# Patient Record
Sex: Female | Born: 1985 | Race: White | Hispanic: No | Marital: Married | Smoking: Former smoker
Health system: Southern US, Community
[De-identification: ages and names within clinical notes are randomized; demographics above are authoritative.]

## PROBLEM LIST (undated history)

## (undated) DIAGNOSIS — N390 Urinary tract infection, site not specified: Secondary | ICD-10-CM

## (undated) DIAGNOSIS — N809 Endometriosis, unspecified: Secondary | ICD-10-CM

## (undated) DIAGNOSIS — B009 Herpesviral infection, unspecified: Secondary | ICD-10-CM

## (undated) DIAGNOSIS — F32A Depression, unspecified: Secondary | ICD-10-CM

## (undated) DIAGNOSIS — F419 Anxiety disorder, unspecified: Secondary | ICD-10-CM

## (undated) HISTORY — PX: RHINOPLASTY: SHX2354

## (undated) HISTORY — PX: DG THUMB LEFT HAND: HXRAD1658

## (undated) HISTORY — PX: EXPLORATORY LAPAROTOMY: SUR591

## (undated) HISTORY — PX: TONSILLECTOMY: SUR1361

## (undated) HISTORY — PX: ANKLE SURGERY: SHX546

## (undated) HISTORY — PX: ANTERIOR CRUCIATE LIGAMENT REPAIR: SHX115

## (undated) HISTORY — PX: ADENOIDECTOMY: SHX5191

---

## 2016-03-06 ENCOUNTER — Other Ambulatory Visit: Payer: Self-pay | Admitting: Allergy and Immunology

## 2016-03-06 NOTE — Telephone Encounter (Signed)
Needs ov for further refills 

## 2016-03-15 DIAGNOSIS — M7671 Peroneal tendinitis, right leg: Secondary | ICD-10-CM | POA: Diagnosis not present

## 2016-04-25 ENCOUNTER — Other Ambulatory Visit: Payer: Self-pay | Admitting: Allergy and Immunology

## 2016-05-05 DIAGNOSIS — F419 Anxiety disorder, unspecified: Secondary | ICD-10-CM | POA: Diagnosis not present

## 2016-05-05 DIAGNOSIS — R1011 Right upper quadrant pain: Secondary | ICD-10-CM | POA: Diagnosis not present

## 2016-05-05 DIAGNOSIS — R635 Abnormal weight gain: Secondary | ICD-10-CM | POA: Diagnosis not present

## 2016-05-11 DIAGNOSIS — R1011 Right upper quadrant pain: Secondary | ICD-10-CM | POA: Diagnosis not present

## 2016-05-11 DIAGNOSIS — F419 Anxiety disorder, unspecified: Secondary | ICD-10-CM | POA: Diagnosis not present

## 2016-05-16 DIAGNOSIS — F419 Anxiety disorder, unspecified: Secondary | ICD-10-CM | POA: Diagnosis not present

## 2016-07-03 ENCOUNTER — Other Ambulatory Visit: Payer: Self-pay | Admitting: Physician Assistant

## 2016-07-03 DIAGNOSIS — R1011 Right upper quadrant pain: Secondary | ICD-10-CM

## 2016-07-03 DIAGNOSIS — R110 Nausea: Secondary | ICD-10-CM | POA: Diagnosis not present

## 2016-07-03 DIAGNOSIS — R198 Other specified symptoms and signs involving the digestive system and abdomen: Secondary | ICD-10-CM | POA: Diagnosis not present

## 2016-07-12 ENCOUNTER — Ambulatory Visit
Admission: RE | Admit: 2016-07-12 | Discharge: 2016-07-12 | Disposition: A | Discharge status: Home / Self Care | Payer: Self-pay | Source: Ambulatory Visit | Attending: Physician Assistant | Admitting: Physician Assistant

## 2016-07-12 DIAGNOSIS — R1011 Right upper quadrant pain: Secondary | ICD-10-CM

## 2016-07-12 DIAGNOSIS — R109 Unspecified abdominal pain: Secondary | ICD-10-CM | POA: Diagnosis not present

## 2016-07-12 DIAGNOSIS — R110 Nausea: Secondary | ICD-10-CM

## 2016-07-31 ENCOUNTER — Other Ambulatory Visit: Payer: Self-pay | Admitting: Physician Assistant

## 2016-07-31 ENCOUNTER — Other Ambulatory Visit (HOSPITAL_COMMUNITY): Payer: Self-pay | Admitting: Physician Assistant

## 2016-07-31 DIAGNOSIS — R197 Diarrhea, unspecified: Secondary | ICD-10-CM

## 2016-07-31 DIAGNOSIS — R109 Unspecified abdominal pain: Secondary | ICD-10-CM | POA: Diagnosis not present

## 2016-07-31 DIAGNOSIS — R110 Nausea: Secondary | ICD-10-CM | POA: Diagnosis not present

## 2016-07-31 DIAGNOSIS — R1011 Right upper quadrant pain: Secondary | ICD-10-CM

## 2016-08-10 ENCOUNTER — Ambulatory Visit
Admission: RE | Admit: 2016-08-10 | Discharge: 2016-08-10 | Disposition: A | Discharge status: Home / Self Care | Payer: BLUE CROSS/BLUE SHIELD | Source: Ambulatory Visit | Attending: Physician Assistant | Admitting: Physician Assistant

## 2016-08-10 ENCOUNTER — Encounter: Payer: Self-pay | Admitting: Radiology

## 2016-08-10 DIAGNOSIS — R110 Nausea: Secondary | ICD-10-CM

## 2016-08-10 DIAGNOSIS — R197 Diarrhea, unspecified: Secondary | ICD-10-CM

## 2016-08-10 DIAGNOSIS — R109 Unspecified abdominal pain: Secondary | ICD-10-CM

## 2016-08-10 DIAGNOSIS — R1031 Right lower quadrant pain: Secondary | ICD-10-CM | POA: Diagnosis not present

## 2016-08-10 MED ORDER — IOPAMIDOL (ISOVUE-300) INJECTION 61%
100.0000 mL | Freq: Once | INTRAVENOUS | Status: AC | PRN
Start: 2016-08-10 — End: 2016-08-10
  Administered 2016-08-10: 100 mL via INTRAVENOUS

## 2016-08-15 ENCOUNTER — Encounter (HOSPITAL_COMMUNITY)
Admission: RE | Admit: 2016-08-15 | Discharge: 2016-08-15 | Disposition: A | Discharge status: Home / Self Care | Payer: BLUE CROSS/BLUE SHIELD | Source: Ambulatory Visit | Attending: Physician Assistant | Admitting: Physician Assistant

## 2016-08-15 DIAGNOSIS — R110 Nausea: Secondary | ICD-10-CM | POA: Insufficient documentation

## 2016-08-15 DIAGNOSIS — R1011 Right upper quadrant pain: Secondary | ICD-10-CM | POA: Diagnosis not present

## 2016-08-15 MED ORDER — TECHNETIUM TC 99M MEBROFENIN IV KIT
5.2000 | PACK | Freq: Once | INTRAVENOUS | Status: AC | PRN
  Administered 2016-08-15: 5 via INTRAVENOUS

## 2016-09-04 DIAGNOSIS — J3089 Other allergic rhinitis: Secondary | ICD-10-CM | POA: Diagnosis not present

## 2016-09-08 DIAGNOSIS — R1011 Right upper quadrant pain: Secondary | ICD-10-CM | POA: Diagnosis not present

## 2016-09-08 DIAGNOSIS — N809 Endometriosis, unspecified: Secondary | ICD-10-CM | POA: Diagnosis not present

## 2016-11-02 DIAGNOSIS — Z713 Dietary counseling and surveillance: Secondary | ICD-10-CM | POA: Diagnosis not present

## 2016-11-20 DIAGNOSIS — J019 Acute sinusitis, unspecified: Secondary | ICD-10-CM | POA: Diagnosis not present

## 2017-01-02 DIAGNOSIS — Z713 Dietary counseling and surveillance: Secondary | ICD-10-CM | POA: Diagnosis not present

## 2017-01-23 DIAGNOSIS — N809 Endometriosis, unspecified: Secondary | ICD-10-CM | POA: Diagnosis not present

## 2017-01-23 DIAGNOSIS — Z01419 Encounter for gynecological examination (general) (routine) without abnormal findings: Secondary | ICD-10-CM | POA: Diagnosis not present

## 2017-01-23 DIAGNOSIS — Z6832 Body mass index (BMI) 32.0-32.9, adult: Secondary | ICD-10-CM | POA: Diagnosis not present

## 2017-01-23 DIAGNOSIS — L680 Hirsutism: Secondary | ICD-10-CM | POA: Diagnosis not present

## 2017-02-09 DIAGNOSIS — J3089 Other allergic rhinitis: Secondary | ICD-10-CM | POA: Diagnosis not present

## 2017-02-16 DIAGNOSIS — Z713 Dietary counseling and surveillance: Secondary | ICD-10-CM | POA: Diagnosis not present

## 2017-03-01 IMAGING — CT CT ABD-PELV W/ CM
1 of 2 series · 15 of 32 positions shown, 19 images · IV contrast (APPLIED)
Comparison: None.

CLINICAL DATA: Right lower quadrant/flank pain.

EXAM:
CT ABDOMEN AND PELVIS WITH CONTRAST
TECHNIQUE: Multidetector CT imaging of the abdomen and pelvis was performed
using the standard protocol following bolus administration of
intravenous contrast.
CONTRAST:  100mL SQE2CU-IAA IOPAMIDOL (SQE2CU-IAA) INJECTION 61%

[Series 2: abd/pelvis w/cm · axial · 0.82mm/px · z∈[-475,-35]mm · 15 of 97 slices shown, 19 images]
[im 5/97  soft-tissue]
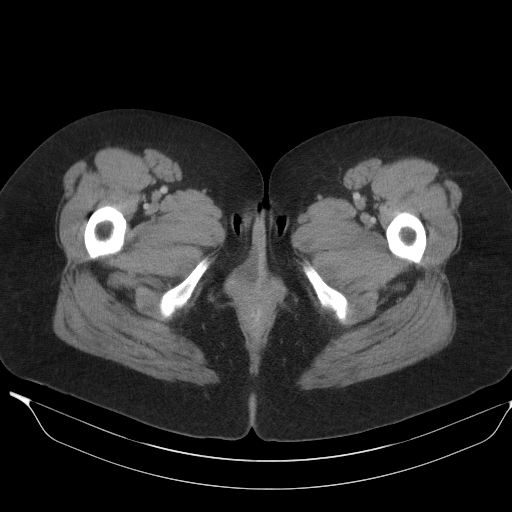
[im 5/97  bone]
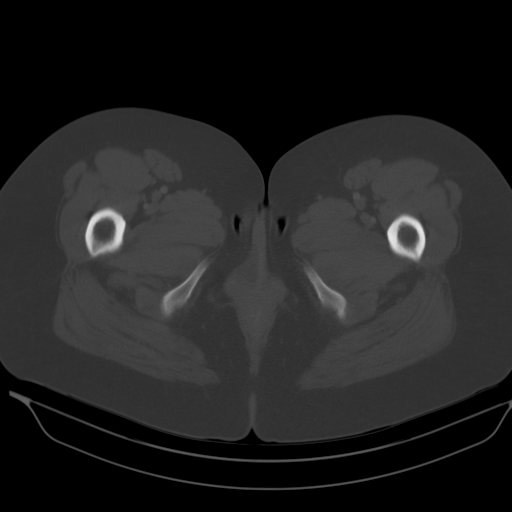
[im 13/97  soft-tissue]
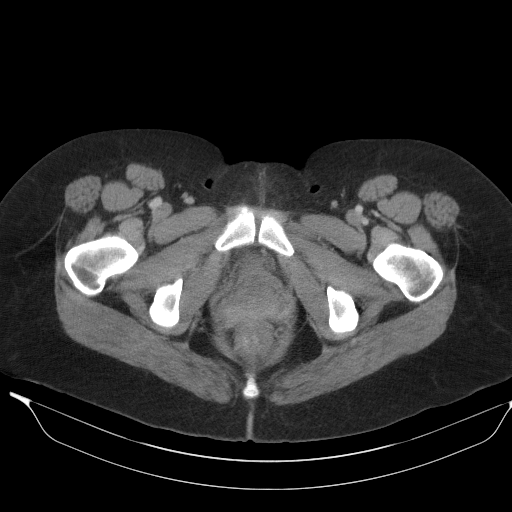
[im 21/97  soft-tissue]
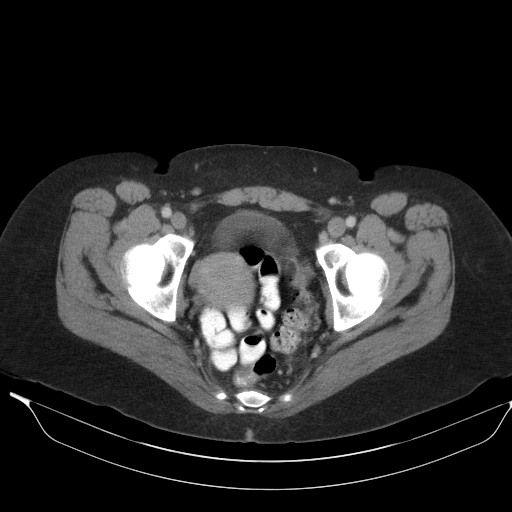
[im 29/97  soft-tissue]
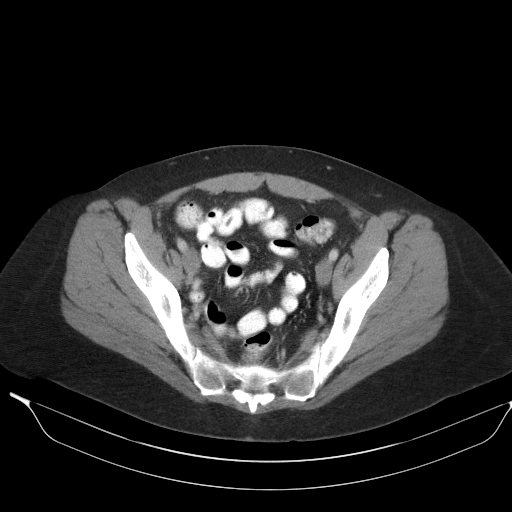
[im 33/97  soft-tissue]
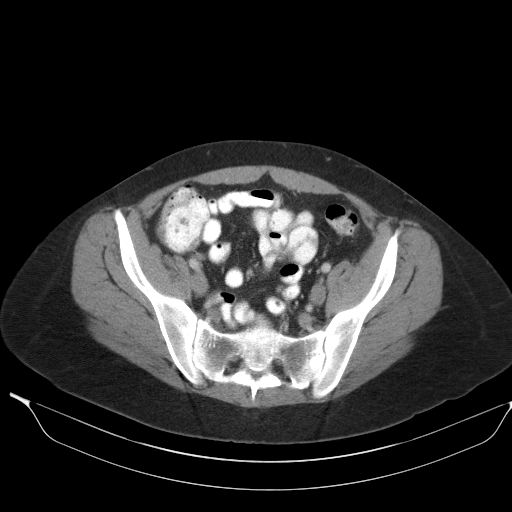
[im 41/97  soft-tissue]
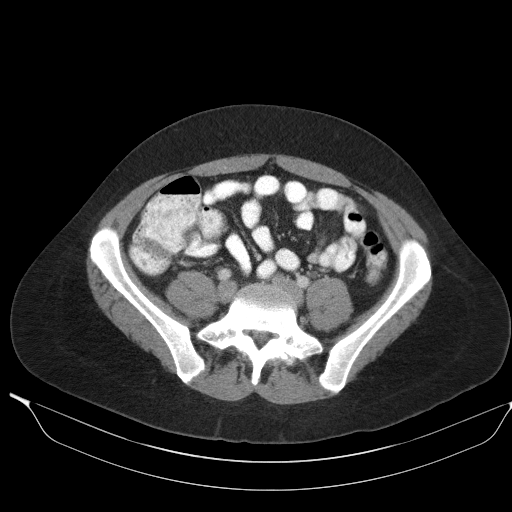
[im 49/97  soft-tissue]
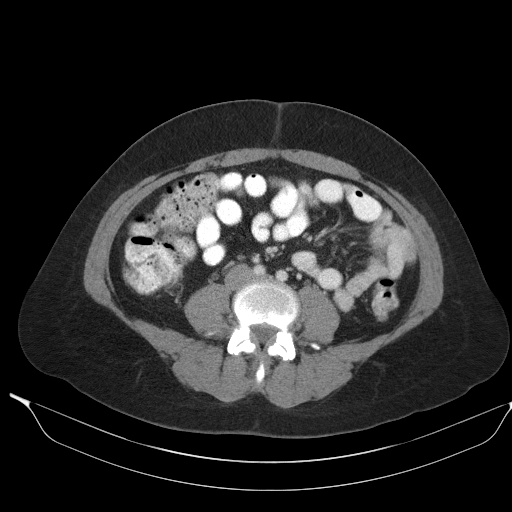
[im 57/97  soft-tissue]
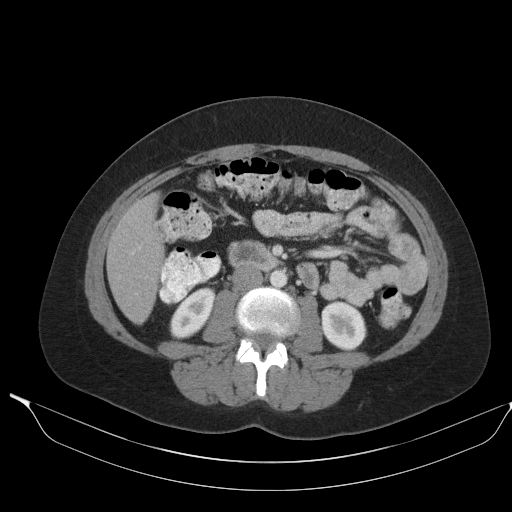
[im 65/97  soft-tissue]
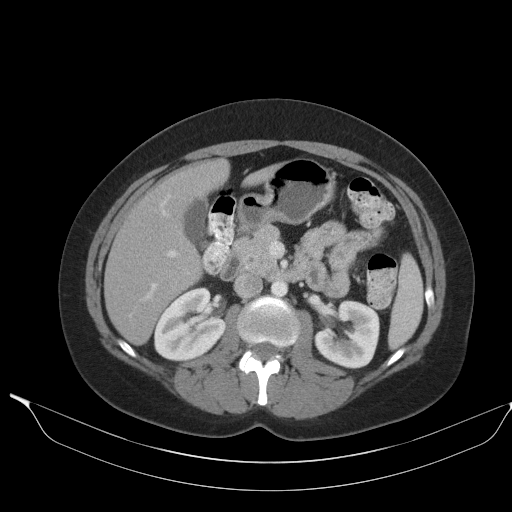
[im 65/97  bone]
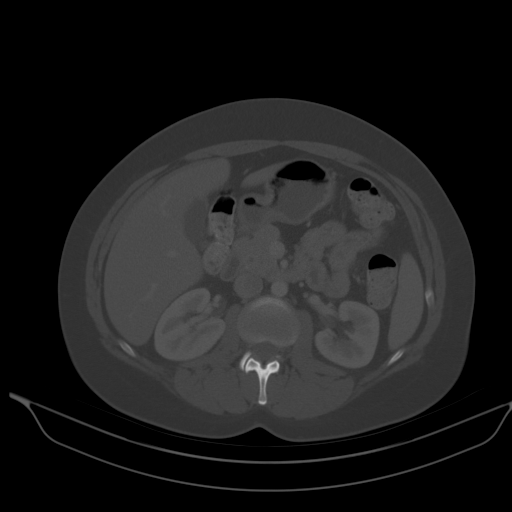
[im 69/97  soft-tissue]
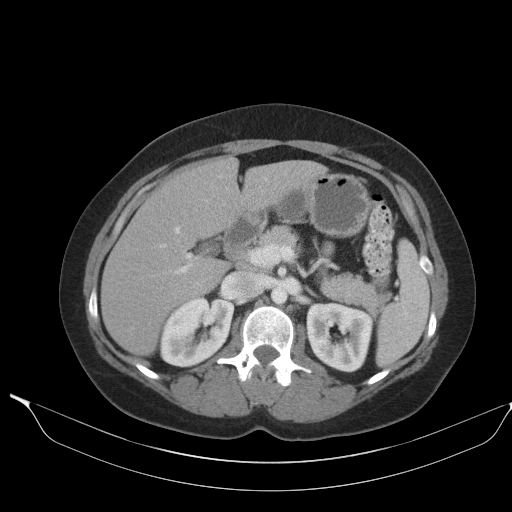
[im 77/97  soft-tissue]
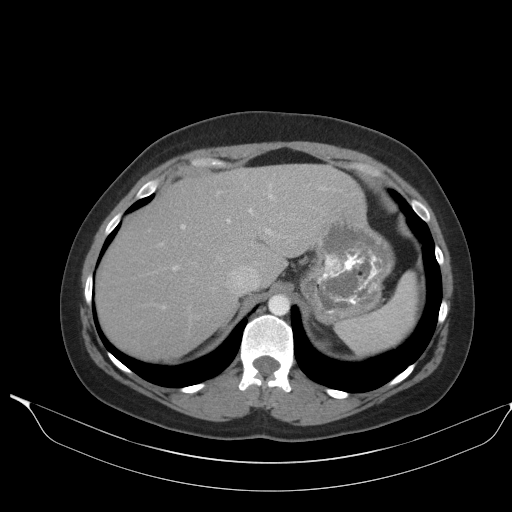
[im 81/97  lung]
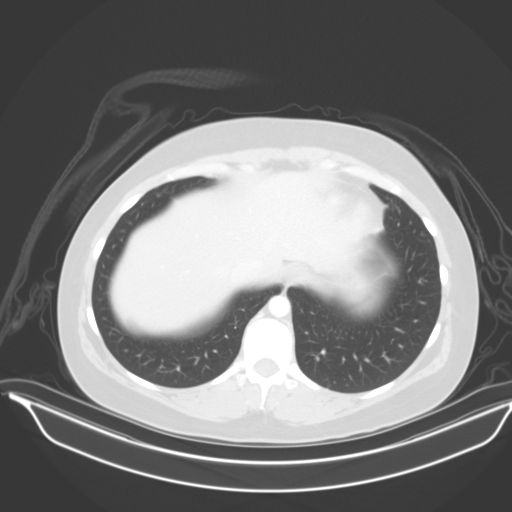
[im 85/97  soft-tissue]
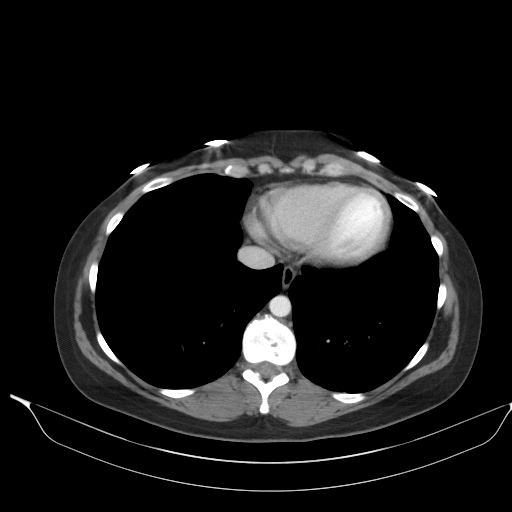
[im 85/97  lung]
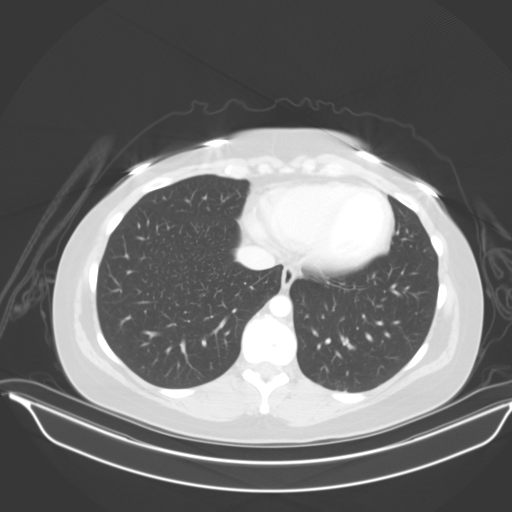
[im 89/97  lung]
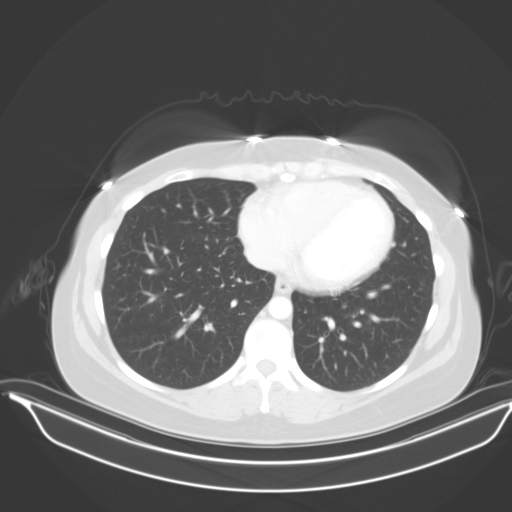
[im 93/97  soft-tissue]
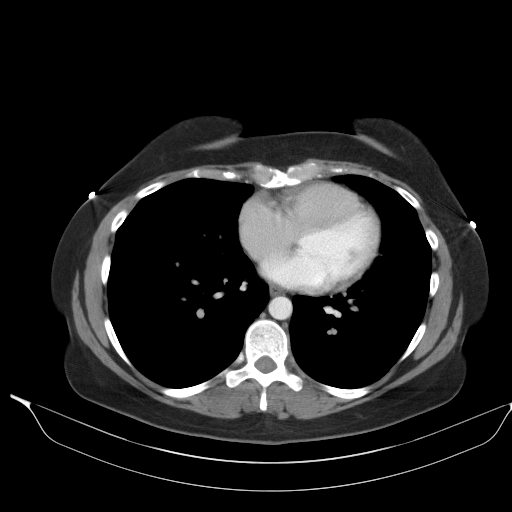
[im 93/97  lung]
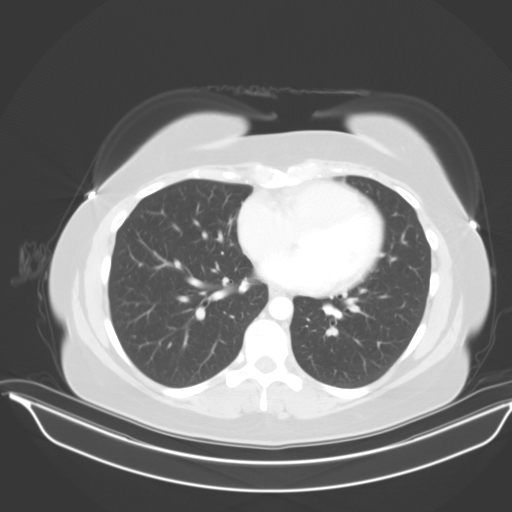

[15 of 32 positions shown; findings below may reference images not displayed]

FINDINGS: Lower chest: The lung bases are clear. There is no pleural or
pericardial effusion.

Hepatobiliary: Hepatic steatosis is noted. There is no focal liver
abnormality identified.

Pancreas: No inflammation or mass identified.

Spleen: The spleen appears normal.

Adrenals/Urinary Tract: The adrenal glands are normal. Normal
appearance of both kidneys. No mass or hydronephrosis. The urinary
bladder appears normal.

Stomach/Bowel: The stomach is within normal limits. The small bowel
loops have a normal course and caliber. No obstruction. The appendix
is visualized and appears within normal limits, image 49 of series
3. No pathologic dilatation of the colon.

Vascular/Lymphatic: Normal appearance of the abdominal aorta. No
enlarged retroperitoneal or mesenteric adenopathy. No enlarged
pelvic or inguinal lymph nodes.

Reproductive: No mass identified.

Other: There is no ascites or focal fluid collections within the
abdomen or pelvis.

Musculoskeletal: No aggressive lytic or sclerotic bone lesions
identified.
IMPRESSION: 1. No acute findings within the abdomen or pelvis.
2. The appendix is visualized and appears normal.

## 2017-03-05 DIAGNOSIS — B009 Herpesviral infection, unspecified: Secondary | ICD-10-CM | POA: Diagnosis not present

## 2017-03-05 DIAGNOSIS — F419 Anxiety disorder, unspecified: Secondary | ICD-10-CM | POA: Diagnosis not present

## 2017-03-05 DIAGNOSIS — M25561 Pain in right knee: Secondary | ICD-10-CM | POA: Diagnosis not present

## 2017-03-05 DIAGNOSIS — J309 Allergic rhinitis, unspecified: Secondary | ICD-10-CM | POA: Diagnosis not present

## 2017-03-23 DIAGNOSIS — J069 Acute upper respiratory infection, unspecified: Secondary | ICD-10-CM | POA: Diagnosis not present

## 2017-04-17 DIAGNOSIS — Z713 Dietary counseling and surveillance: Secondary | ICD-10-CM | POA: Diagnosis not present

## 2017-06-01 DIAGNOSIS — L239 Allergic contact dermatitis, unspecified cause: Secondary | ICD-10-CM | POA: Diagnosis not present

## 2017-06-22 DIAGNOSIS — Z0000 Encounter for general adult medical examination without abnormal findings: Secondary | ICD-10-CM | POA: Diagnosis not present

## 2017-06-26 DIAGNOSIS — Z713 Dietary counseling and surveillance: Secondary | ICD-10-CM | POA: Diagnosis not present

## 2017-07-23 DIAGNOSIS — M545 Low back pain: Secondary | ICD-10-CM | POA: Diagnosis not present

## 2017-07-23 DIAGNOSIS — F418 Other specified anxiety disorders: Secondary | ICD-10-CM | POA: Diagnosis not present

## 2017-07-23 DIAGNOSIS — R635 Abnormal weight gain: Secondary | ICD-10-CM | POA: Diagnosis not present

## 2017-08-21 DIAGNOSIS — Z713 Dietary counseling and surveillance: Secondary | ICD-10-CM | POA: Diagnosis not present

## 2017-08-31 DIAGNOSIS — J3089 Other allergic rhinitis: Secondary | ICD-10-CM | POA: Diagnosis not present

## 2017-09-12 DIAGNOSIS — F418 Other specified anxiety disorders: Secondary | ICD-10-CM | POA: Diagnosis not present

## 2017-10-09 DIAGNOSIS — Z713 Dietary counseling and surveillance: Secondary | ICD-10-CM | POA: Diagnosis not present

## 2017-10-17 DIAGNOSIS — M4185 Other forms of scoliosis, thoracolumbar region: Secondary | ICD-10-CM | POA: Diagnosis not present

## 2017-10-17 DIAGNOSIS — M545 Low back pain: Secondary | ICD-10-CM | POA: Diagnosis not present

## 2017-10-17 DIAGNOSIS — R102 Pelvic and perineal pain: Secondary | ICD-10-CM | POA: Diagnosis not present

## 2017-10-17 DIAGNOSIS — F17210 Nicotine dependence, cigarettes, uncomplicated: Secondary | ICD-10-CM | POA: Diagnosis not present

## 2017-10-17 DIAGNOSIS — N946 Dysmenorrhea, unspecified: Secondary | ICD-10-CM | POA: Diagnosis not present

## 2017-10-17 DIAGNOSIS — G8929 Other chronic pain: Secondary | ICD-10-CM | POA: Diagnosis not present

## 2017-10-17 DIAGNOSIS — N809 Endometriosis, unspecified: Secondary | ICD-10-CM | POA: Diagnosis not present

## 2017-10-17 DIAGNOSIS — Z888 Allergy status to other drugs, medicaments and biological substances status: Secondary | ICD-10-CM | POA: Diagnosis not present

## 2017-10-17 DIAGNOSIS — M5416 Radiculopathy, lumbar region: Secondary | ICD-10-CM | POA: Diagnosis not present

## 2017-10-17 DIAGNOSIS — M47814 Spondylosis without myelopathy or radiculopathy, thoracic region: Secondary | ICD-10-CM | POA: Diagnosis not present

## 2017-10-31 DIAGNOSIS — J069 Acute upper respiratory infection, unspecified: Secondary | ICD-10-CM | POA: Diagnosis not present

## 2017-12-12 DIAGNOSIS — Z888 Allergy status to other drugs, medicaments and biological substances status: Secondary | ICD-10-CM | POA: Diagnosis not present

## 2017-12-12 DIAGNOSIS — N946 Dysmenorrhea, unspecified: Secondary | ICD-10-CM | POA: Diagnosis not present

## 2017-12-12 DIAGNOSIS — N920 Excessive and frequent menstruation with regular cycle: Secondary | ICD-10-CM | POA: Diagnosis not present

## 2017-12-12 DIAGNOSIS — N809 Endometriosis, unspecified: Secondary | ICD-10-CM | POA: Diagnosis not present

## 2017-12-12 DIAGNOSIS — R102 Pelvic and perineal pain: Secondary | ICD-10-CM | POA: Diagnosis not present

## 2017-12-12 DIAGNOSIS — F17210 Nicotine dependence, cigarettes, uncomplicated: Secondary | ICD-10-CM | POA: Diagnosis not present

## 2017-12-18 DIAGNOSIS — Z713 Dietary counseling and surveillance: Secondary | ICD-10-CM | POA: Diagnosis not present

## 2018-02-08 DIAGNOSIS — J3089 Other allergic rhinitis: Secondary | ICD-10-CM | POA: Diagnosis not present

## 2018-02-12 DIAGNOSIS — Z713 Dietary counseling and surveillance: Secondary | ICD-10-CM | POA: Diagnosis not present

## 2018-02-27 DIAGNOSIS — Z79899 Other long term (current) drug therapy: Secondary | ICD-10-CM | POA: Diagnosis not present

## 2018-02-27 DIAGNOSIS — Z0000 Encounter for general adult medical examination without abnormal findings: Secondary | ICD-10-CM | POA: Diagnosis not present

## 2018-02-27 DIAGNOSIS — N946 Dysmenorrhea, unspecified: Secondary | ICD-10-CM | POA: Diagnosis not present

## 2018-02-27 DIAGNOSIS — R102 Pelvic and perineal pain: Secondary | ICD-10-CM | POA: Diagnosis not present

## 2018-04-23 DIAGNOSIS — T63304A Toxic effect of unspecified spider venom, undetermined, initial encounter: Secondary | ICD-10-CM | POA: Diagnosis not present

## 2018-04-23 DIAGNOSIS — J301 Allergic rhinitis due to pollen: Secondary | ICD-10-CM | POA: Diagnosis not present

## 2018-04-26 DIAGNOSIS — F418 Other specified anxiety disorders: Secondary | ICD-10-CM | POA: Diagnosis not present

## 2018-04-26 DIAGNOSIS — R635 Abnormal weight gain: Secondary | ICD-10-CM | POA: Diagnosis not present

## 2018-05-13 DIAGNOSIS — M25561 Pain in right knee: Secondary | ICD-10-CM | POA: Diagnosis not present

## 2018-05-16 DIAGNOSIS — M25561 Pain in right knee: Secondary | ICD-10-CM | POA: Diagnosis not present

## 2018-08-14 DIAGNOSIS — E669 Obesity, unspecified: Secondary | ICD-10-CM | POA: Diagnosis not present

## 2018-08-14 DIAGNOSIS — Z6831 Body mass index (BMI) 31.0-31.9, adult: Secondary | ICD-10-CM | POA: Diagnosis not present

## 2018-10-08 DIAGNOSIS — J302 Other seasonal allergic rhinitis: Secondary | ICD-10-CM | POA: Diagnosis not present

## 2018-10-16 DIAGNOSIS — Z6832 Body mass index (BMI) 32.0-32.9, adult: Secondary | ICD-10-CM | POA: Diagnosis not present

## 2018-10-16 DIAGNOSIS — E669 Obesity, unspecified: Secondary | ICD-10-CM | POA: Diagnosis not present

## 2018-10-21 DIAGNOSIS — J320 Chronic maxillary sinusitis: Secondary | ICD-10-CM | POA: Diagnosis not present

## 2018-10-21 DIAGNOSIS — H6983 Other specified disorders of Eustachian tube, bilateral: Secondary | ICD-10-CM | POA: Diagnosis not present

## 2018-10-25 DIAGNOSIS — Z6832 Body mass index (BMI) 32.0-32.9, adult: Secondary | ICD-10-CM | POA: Diagnosis not present

## 2018-10-25 DIAGNOSIS — E669 Obesity, unspecified: Secondary | ICD-10-CM | POA: Diagnosis not present

## 2018-10-25 DIAGNOSIS — Z713 Dietary counseling and surveillance: Secondary | ICD-10-CM | POA: Diagnosis not present

## 2018-11-07 DIAGNOSIS — S83511A Sprain of anterior cruciate ligament of right knee, initial encounter: Secondary | ICD-10-CM | POA: Diagnosis not present

## 2018-11-07 DIAGNOSIS — Z7951 Long term (current) use of inhaled steroids: Secondary | ICD-10-CM | POA: Diagnosis not present

## 2018-11-07 DIAGNOSIS — M93861 Other specified osteochondropathies, right lower leg: Secondary | ICD-10-CM | POA: Diagnosis not present

## 2018-11-07 DIAGNOSIS — F411 Generalized anxiety disorder: Secondary | ICD-10-CM | POA: Diagnosis not present

## 2018-11-07 DIAGNOSIS — M238X1 Other internal derangements of right knee: Secondary | ICD-10-CM | POA: Diagnosis not present

## 2018-11-07 DIAGNOSIS — Z91010 Allergy to peanuts: Secondary | ICD-10-CM | POA: Diagnosis not present

## 2018-11-07 DIAGNOSIS — Z888 Allergy status to other drugs, medicaments and biological substances status: Secondary | ICD-10-CM | POA: Diagnosis not present

## 2018-11-07 DIAGNOSIS — G8918 Other acute postprocedural pain: Secondary | ICD-10-CM | POA: Diagnosis not present

## 2018-11-07 DIAGNOSIS — F17210 Nicotine dependence, cigarettes, uncomplicated: Secondary | ICD-10-CM | POA: Diagnosis not present

## 2018-11-07 DIAGNOSIS — F329 Major depressive disorder, single episode, unspecified: Secondary | ICD-10-CM | POA: Diagnosis not present

## 2018-11-07 DIAGNOSIS — Z79899 Other long term (current) drug therapy: Secondary | ICD-10-CM | POA: Diagnosis not present

## 2018-11-14 DIAGNOSIS — M25561 Pain in right knee: Secondary | ICD-10-CM | POA: Diagnosis not present

## 2018-11-15 DIAGNOSIS — R262 Difficulty in walking, not elsewhere classified: Secondary | ICD-10-CM | POA: Diagnosis not present

## 2018-11-15 DIAGNOSIS — M25561 Pain in right knee: Secondary | ICD-10-CM | POA: Diagnosis not present

## 2018-11-15 DIAGNOSIS — M25661 Stiffness of right knee, not elsewhere classified: Secondary | ICD-10-CM | POA: Diagnosis not present

## 2018-11-15 DIAGNOSIS — M6281 Muscle weakness (generalized): Secondary | ICD-10-CM | POA: Diagnosis not present

## 2018-11-18 DIAGNOSIS — R262 Difficulty in walking, not elsewhere classified: Secondary | ICD-10-CM | POA: Diagnosis not present

## 2018-11-18 DIAGNOSIS — M6281 Muscle weakness (generalized): Secondary | ICD-10-CM | POA: Diagnosis not present

## 2018-11-18 DIAGNOSIS — M25561 Pain in right knee: Secondary | ICD-10-CM | POA: Diagnosis not present

## 2018-11-18 DIAGNOSIS — M25661 Stiffness of right knee, not elsewhere classified: Secondary | ICD-10-CM | POA: Diagnosis not present

## 2018-11-19 DIAGNOSIS — M25561 Pain in right knee: Secondary | ICD-10-CM | POA: Diagnosis not present

## 2018-11-19 DIAGNOSIS — M6281 Muscle weakness (generalized): Secondary | ICD-10-CM | POA: Diagnosis not present

## 2018-11-19 DIAGNOSIS — R262 Difficulty in walking, not elsewhere classified: Secondary | ICD-10-CM | POA: Diagnosis not present

## 2018-11-19 DIAGNOSIS — M25661 Stiffness of right knee, not elsewhere classified: Secondary | ICD-10-CM | POA: Diagnosis not present

## 2018-11-22 DIAGNOSIS — R262 Difficulty in walking, not elsewhere classified: Secondary | ICD-10-CM | POA: Diagnosis not present

## 2018-11-22 DIAGNOSIS — M6281 Muscle weakness (generalized): Secondary | ICD-10-CM | POA: Diagnosis not present

## 2018-11-22 DIAGNOSIS — M25561 Pain in right knee: Secondary | ICD-10-CM | POA: Diagnosis not present

## 2018-11-22 DIAGNOSIS — M25661 Stiffness of right knee, not elsewhere classified: Secondary | ICD-10-CM | POA: Diagnosis not present

## 2018-11-25 DIAGNOSIS — R262 Difficulty in walking, not elsewhere classified: Secondary | ICD-10-CM | POA: Diagnosis not present

## 2018-11-25 DIAGNOSIS — M25661 Stiffness of right knee, not elsewhere classified: Secondary | ICD-10-CM | POA: Diagnosis not present

## 2018-11-25 DIAGNOSIS — M25561 Pain in right knee: Secondary | ICD-10-CM | POA: Diagnosis not present

## 2018-11-25 DIAGNOSIS — M6281 Muscle weakness (generalized): Secondary | ICD-10-CM | POA: Diagnosis not present

## 2018-11-28 DIAGNOSIS — M6281 Muscle weakness (generalized): Secondary | ICD-10-CM | POA: Diagnosis not present

## 2018-11-28 DIAGNOSIS — M25561 Pain in right knee: Secondary | ICD-10-CM | POA: Diagnosis not present

## 2018-11-28 DIAGNOSIS — M25661 Stiffness of right knee, not elsewhere classified: Secondary | ICD-10-CM | POA: Diagnosis not present

## 2018-11-28 DIAGNOSIS — R262 Difficulty in walking, not elsewhere classified: Secondary | ICD-10-CM | POA: Diagnosis not present

## 2018-11-29 DIAGNOSIS — R262 Difficulty in walking, not elsewhere classified: Secondary | ICD-10-CM | POA: Diagnosis not present

## 2018-11-29 DIAGNOSIS — M6281 Muscle weakness (generalized): Secondary | ICD-10-CM | POA: Diagnosis not present

## 2018-11-29 DIAGNOSIS — M25661 Stiffness of right knee, not elsewhere classified: Secondary | ICD-10-CM | POA: Diagnosis not present

## 2018-11-29 DIAGNOSIS — M25561 Pain in right knee: Secondary | ICD-10-CM | POA: Diagnosis not present

## 2018-12-02 DIAGNOSIS — M25561 Pain in right knee: Secondary | ICD-10-CM | POA: Diagnosis not present

## 2018-12-02 DIAGNOSIS — R262 Difficulty in walking, not elsewhere classified: Secondary | ICD-10-CM | POA: Diagnosis not present

## 2018-12-02 DIAGNOSIS — M25661 Stiffness of right knee, not elsewhere classified: Secondary | ICD-10-CM | POA: Diagnosis not present

## 2018-12-02 DIAGNOSIS — M6281 Muscle weakness (generalized): Secondary | ICD-10-CM | POA: Diagnosis not present

## 2018-12-05 DIAGNOSIS — F418 Other specified anxiety disorders: Secondary | ICD-10-CM | POA: Diagnosis not present

## 2018-12-09 DIAGNOSIS — M6281 Muscle weakness (generalized): Secondary | ICD-10-CM | POA: Diagnosis not present

## 2018-12-09 DIAGNOSIS — R262 Difficulty in walking, not elsewhere classified: Secondary | ICD-10-CM | POA: Diagnosis not present

## 2018-12-09 DIAGNOSIS — M25561 Pain in right knee: Secondary | ICD-10-CM | POA: Diagnosis not present

## 2018-12-09 DIAGNOSIS — M25661 Stiffness of right knee, not elsewhere classified: Secondary | ICD-10-CM | POA: Diagnosis not present

## 2018-12-12 DIAGNOSIS — M25561 Pain in right knee: Secondary | ICD-10-CM | POA: Diagnosis not present

## 2018-12-12 DIAGNOSIS — R262 Difficulty in walking, not elsewhere classified: Secondary | ICD-10-CM | POA: Diagnosis not present

## 2018-12-12 DIAGNOSIS — M25661 Stiffness of right knee, not elsewhere classified: Secondary | ICD-10-CM | POA: Diagnosis not present

## 2018-12-12 DIAGNOSIS — M6281 Muscle weakness (generalized): Secondary | ICD-10-CM | POA: Diagnosis not present

## 2018-12-17 DIAGNOSIS — M25561 Pain in right knee: Secondary | ICD-10-CM | POA: Diagnosis not present

## 2018-12-17 DIAGNOSIS — M6281 Muscle weakness (generalized): Secondary | ICD-10-CM | POA: Diagnosis not present

## 2018-12-17 DIAGNOSIS — M25661 Stiffness of right knee, not elsewhere classified: Secondary | ICD-10-CM | POA: Diagnosis not present

## 2018-12-17 DIAGNOSIS — R262 Difficulty in walking, not elsewhere classified: Secondary | ICD-10-CM | POA: Diagnosis not present

## 2018-12-18 DIAGNOSIS — Z4789 Encounter for other orthopedic aftercare: Secondary | ICD-10-CM | POA: Diagnosis not present

## 2018-12-18 DIAGNOSIS — M25561 Pain in right knee: Secondary | ICD-10-CM | POA: Diagnosis not present

## 2018-12-19 DIAGNOSIS — R262 Difficulty in walking, not elsewhere classified: Secondary | ICD-10-CM | POA: Diagnosis not present

## 2018-12-19 DIAGNOSIS — M25661 Stiffness of right knee, not elsewhere classified: Secondary | ICD-10-CM | POA: Diagnosis not present

## 2018-12-19 DIAGNOSIS — M6281 Muscle weakness (generalized): Secondary | ICD-10-CM | POA: Diagnosis not present

## 2018-12-19 DIAGNOSIS — M25561 Pain in right knee: Secondary | ICD-10-CM | POA: Diagnosis not present

## 2018-12-26 DIAGNOSIS — M25661 Stiffness of right knee, not elsewhere classified: Secondary | ICD-10-CM | POA: Diagnosis not present

## 2018-12-26 DIAGNOSIS — M6281 Muscle weakness (generalized): Secondary | ICD-10-CM | POA: Diagnosis not present

## 2018-12-26 DIAGNOSIS — R262 Difficulty in walking, not elsewhere classified: Secondary | ICD-10-CM | POA: Diagnosis not present

## 2018-12-26 DIAGNOSIS — M25561 Pain in right knee: Secondary | ICD-10-CM | POA: Diagnosis not present

## 2018-12-27 DIAGNOSIS — T300 Burn of unspecified body region, unspecified degree: Secondary | ICD-10-CM | POA: Diagnosis not present

## 2019-01-09 DIAGNOSIS — M25561 Pain in right knee: Secondary | ICD-10-CM | POA: Diagnosis not present

## 2019-01-09 DIAGNOSIS — M25661 Stiffness of right knee, not elsewhere classified: Secondary | ICD-10-CM | POA: Diagnosis not present

## 2019-01-09 DIAGNOSIS — R262 Difficulty in walking, not elsewhere classified: Secondary | ICD-10-CM | POA: Diagnosis not present

## 2019-01-09 DIAGNOSIS — M6281 Muscle weakness (generalized): Secondary | ICD-10-CM | POA: Diagnosis not present

## 2019-01-16 DIAGNOSIS — R262 Difficulty in walking, not elsewhere classified: Secondary | ICD-10-CM | POA: Diagnosis not present

## 2019-01-16 DIAGNOSIS — M25661 Stiffness of right knee, not elsewhere classified: Secondary | ICD-10-CM | POA: Diagnosis not present

## 2019-01-16 DIAGNOSIS — M6281 Muscle weakness (generalized): Secondary | ICD-10-CM | POA: Diagnosis not present

## 2019-01-16 DIAGNOSIS — M25561 Pain in right knee: Secondary | ICD-10-CM | POA: Diagnosis not present

## 2019-01-30 DIAGNOSIS — M25661 Stiffness of right knee, not elsewhere classified: Secondary | ICD-10-CM | POA: Diagnosis not present

## 2019-01-30 DIAGNOSIS — R262 Difficulty in walking, not elsewhere classified: Secondary | ICD-10-CM | POA: Diagnosis not present

## 2019-01-30 DIAGNOSIS — M25561 Pain in right knee: Secondary | ICD-10-CM | POA: Diagnosis not present

## 2019-01-30 DIAGNOSIS — M6281 Muscle weakness (generalized): Secondary | ICD-10-CM | POA: Diagnosis not present

## 2019-02-05 DIAGNOSIS — R635 Abnormal weight gain: Secondary | ICD-10-CM | POA: Diagnosis not present

## 2019-02-05 DIAGNOSIS — Z131 Encounter for screening for diabetes mellitus: Secondary | ICD-10-CM | POA: Diagnosis not present

## 2019-02-05 DIAGNOSIS — E6609 Other obesity due to excess calories: Secondary | ICD-10-CM | POA: Diagnosis not present

## 2019-02-05 DIAGNOSIS — Z13220 Encounter for screening for lipoid disorders: Secondary | ICD-10-CM | POA: Diagnosis not present

## 2019-02-05 DIAGNOSIS — E559 Vitamin D deficiency, unspecified: Secondary | ICD-10-CM | POA: Diagnosis not present

## 2019-02-06 DIAGNOSIS — M25661 Stiffness of right knee, not elsewhere classified: Secondary | ICD-10-CM | POA: Diagnosis not present

## 2019-02-06 DIAGNOSIS — M6281 Muscle weakness (generalized): Secondary | ICD-10-CM | POA: Diagnosis not present

## 2019-02-06 DIAGNOSIS — M25561 Pain in right knee: Secondary | ICD-10-CM | POA: Diagnosis not present

## 2019-02-06 DIAGNOSIS — R262 Difficulty in walking, not elsewhere classified: Secondary | ICD-10-CM | POA: Diagnosis not present

## 2019-02-07 DIAGNOSIS — M25561 Pain in right knee: Secondary | ICD-10-CM | POA: Diagnosis not present

## 2019-02-13 DIAGNOSIS — M25661 Stiffness of right knee, not elsewhere classified: Secondary | ICD-10-CM | POA: Diagnosis not present

## 2019-02-13 DIAGNOSIS — M25561 Pain in right knee: Secondary | ICD-10-CM | POA: Diagnosis not present

## 2019-02-13 DIAGNOSIS — M6281 Muscle weakness (generalized): Secondary | ICD-10-CM | POA: Diagnosis not present

## 2019-02-13 DIAGNOSIS — R262 Difficulty in walking, not elsewhere classified: Secondary | ICD-10-CM | POA: Diagnosis not present

## 2019-02-17 DIAGNOSIS — L309 Dermatitis, unspecified: Secondary | ICD-10-CM | POA: Diagnosis not present

## 2019-02-27 DIAGNOSIS — M25661 Stiffness of right knee, not elsewhere classified: Secondary | ICD-10-CM | POA: Diagnosis not present

## 2019-02-27 DIAGNOSIS — M6281 Muscle weakness (generalized): Secondary | ICD-10-CM | POA: Diagnosis not present

## 2019-02-27 DIAGNOSIS — M25561 Pain in right knee: Secondary | ICD-10-CM | POA: Diagnosis not present

## 2019-02-27 DIAGNOSIS — R262 Difficulty in walking, not elsewhere classified: Secondary | ICD-10-CM | POA: Diagnosis not present

## 2019-03-05 DIAGNOSIS — R262 Difficulty in walking, not elsewhere classified: Secondary | ICD-10-CM | POA: Diagnosis not present

## 2019-03-05 DIAGNOSIS — M25661 Stiffness of right knee, not elsewhere classified: Secondary | ICD-10-CM | POA: Diagnosis not present

## 2019-03-05 DIAGNOSIS — M25561 Pain in right knee: Secondary | ICD-10-CM | POA: Diagnosis not present

## 2019-03-05 DIAGNOSIS — M6281 Muscle weakness (generalized): Secondary | ICD-10-CM | POA: Diagnosis not present

## 2019-03-13 DIAGNOSIS — M6281 Muscle weakness (generalized): Secondary | ICD-10-CM | POA: Diagnosis not present

## 2019-03-13 DIAGNOSIS — M25661 Stiffness of right knee, not elsewhere classified: Secondary | ICD-10-CM | POA: Diagnosis not present

## 2019-03-13 DIAGNOSIS — R262 Difficulty in walking, not elsewhere classified: Secondary | ICD-10-CM | POA: Diagnosis not present

## 2019-03-13 DIAGNOSIS — M25561 Pain in right knee: Secondary | ICD-10-CM | POA: Diagnosis not present

## 2019-03-20 DIAGNOSIS — M25561 Pain in right knee: Secondary | ICD-10-CM | POA: Diagnosis not present

## 2019-03-20 DIAGNOSIS — R262 Difficulty in walking, not elsewhere classified: Secondary | ICD-10-CM | POA: Diagnosis not present

## 2019-03-20 DIAGNOSIS — M25661 Stiffness of right knee, not elsewhere classified: Secondary | ICD-10-CM | POA: Diagnosis not present

## 2019-03-20 DIAGNOSIS — M6281 Muscle weakness (generalized): Secondary | ICD-10-CM | POA: Diagnosis not present

## 2019-03-24 DIAGNOSIS — L989 Disorder of the skin and subcutaneous tissue, unspecified: Secondary | ICD-10-CM | POA: Diagnosis not present

## 2019-03-27 DIAGNOSIS — M6281 Muscle weakness (generalized): Secondary | ICD-10-CM | POA: Diagnosis not present

## 2019-03-27 DIAGNOSIS — M25561 Pain in right knee: Secondary | ICD-10-CM | POA: Diagnosis not present

## 2019-03-27 DIAGNOSIS — M25661 Stiffness of right knee, not elsewhere classified: Secondary | ICD-10-CM | POA: Diagnosis not present

## 2019-03-27 DIAGNOSIS — R262 Difficulty in walking, not elsewhere classified: Secondary | ICD-10-CM | POA: Diagnosis not present

## 2019-04-04 DIAGNOSIS — M25561 Pain in right knee: Secondary | ICD-10-CM | POA: Diagnosis not present

## 2019-04-04 DIAGNOSIS — Z720 Tobacco use: Secondary | ICD-10-CM | POA: Diagnosis not present

## 2019-04-10 DIAGNOSIS — R262 Difficulty in walking, not elsewhere classified: Secondary | ICD-10-CM | POA: Diagnosis not present

## 2019-04-10 DIAGNOSIS — M25561 Pain in right knee: Secondary | ICD-10-CM | POA: Diagnosis not present

## 2019-04-10 DIAGNOSIS — M6281 Muscle weakness (generalized): Secondary | ICD-10-CM | POA: Diagnosis not present

## 2019-04-10 DIAGNOSIS — M25661 Stiffness of right knee, not elsewhere classified: Secondary | ICD-10-CM | POA: Diagnosis not present

## 2019-04-11 DIAGNOSIS — Z6834 Body mass index (BMI) 34.0-34.9, adult: Secondary | ICD-10-CM | POA: Diagnosis not present

## 2019-04-11 DIAGNOSIS — E6609 Other obesity due to excess calories: Secondary | ICD-10-CM | POA: Diagnosis not present

## 2019-04-11 DIAGNOSIS — E559 Vitamin D deficiency, unspecified: Secondary | ICD-10-CM | POA: Diagnosis not present

## 2019-04-24 DIAGNOSIS — M6281 Muscle weakness (generalized): Secondary | ICD-10-CM | POA: Diagnosis not present

## 2019-04-24 DIAGNOSIS — M25561 Pain in right knee: Secondary | ICD-10-CM | POA: Diagnosis not present

## 2019-04-24 DIAGNOSIS — R262 Difficulty in walking, not elsewhere classified: Secondary | ICD-10-CM | POA: Diagnosis not present

## 2019-04-24 DIAGNOSIS — M25661 Stiffness of right knee, not elsewhere classified: Secondary | ICD-10-CM | POA: Diagnosis not present

## 2019-05-08 DIAGNOSIS — M25561 Pain in right knee: Secondary | ICD-10-CM | POA: Diagnosis not present

## 2019-05-08 DIAGNOSIS — M6281 Muscle weakness (generalized): Secondary | ICD-10-CM | POA: Diagnosis not present

## 2019-05-08 DIAGNOSIS — R262 Difficulty in walking, not elsewhere classified: Secondary | ICD-10-CM | POA: Diagnosis not present

## 2019-05-08 DIAGNOSIS — M25661 Stiffness of right knee, not elsewhere classified: Secondary | ICD-10-CM | POA: Diagnosis not present

## 2019-05-22 DIAGNOSIS — M25561 Pain in right knee: Secondary | ICD-10-CM | POA: Diagnosis not present

## 2019-05-22 DIAGNOSIS — R262 Difficulty in walking, not elsewhere classified: Secondary | ICD-10-CM | POA: Diagnosis not present

## 2019-05-22 DIAGNOSIS — M6281 Muscle weakness (generalized): Secondary | ICD-10-CM | POA: Diagnosis not present

## 2019-05-22 DIAGNOSIS — M25661 Stiffness of right knee, not elsewhere classified: Secondary | ICD-10-CM | POA: Diagnosis not present

## 2019-06-02 DIAGNOSIS — N809 Endometriosis, unspecified: Secondary | ICD-10-CM | POA: Diagnosis not present

## 2019-06-02 DIAGNOSIS — Z0000 Encounter for general adult medical examination without abnormal findings: Secondary | ICD-10-CM | POA: Diagnosis not present

## 2019-06-02 DIAGNOSIS — Z3169 Encounter for other general counseling and advice on procreation: Secondary | ICD-10-CM | POA: Diagnosis not present

## 2019-06-02 DIAGNOSIS — N946 Dysmenorrhea, unspecified: Secondary | ICD-10-CM | POA: Diagnosis not present

## 2019-06-05 DIAGNOSIS — F419 Anxiety disorder, unspecified: Secondary | ICD-10-CM | POA: Diagnosis not present

## 2019-06-13 DIAGNOSIS — M6281 Muscle weakness (generalized): Secondary | ICD-10-CM | POA: Diagnosis not present

## 2019-06-13 DIAGNOSIS — M25661 Stiffness of right knee, not elsewhere classified: Secondary | ICD-10-CM | POA: Diagnosis not present

## 2019-06-13 DIAGNOSIS — R262 Difficulty in walking, not elsewhere classified: Secondary | ICD-10-CM | POA: Diagnosis not present

## 2019-06-13 DIAGNOSIS — M25561 Pain in right knee: Secondary | ICD-10-CM | POA: Diagnosis not present

## 2019-07-18 DIAGNOSIS — Z6835 Body mass index (BMI) 35.0-35.9, adult: Secondary | ICD-10-CM | POA: Diagnosis not present

## 2019-07-18 DIAGNOSIS — E669 Obesity, unspecified: Secondary | ICD-10-CM | POA: Diagnosis not present

## 2019-07-18 DIAGNOSIS — E559 Vitamin D deficiency, unspecified: Secondary | ICD-10-CM | POA: Diagnosis not present

## 2019-08-18 DIAGNOSIS — M25561 Pain in right knee: Secondary | ICD-10-CM | POA: Diagnosis not present

## 2019-09-04 DIAGNOSIS — E669 Obesity, unspecified: Secondary | ICD-10-CM | POA: Diagnosis not present

## 2019-09-04 DIAGNOSIS — Z6835 Body mass index (BMI) 35.0-35.9, adult: Secondary | ICD-10-CM | POA: Diagnosis not present

## 2019-09-04 DIAGNOSIS — E559 Vitamin D deficiency, unspecified: Secondary | ICD-10-CM | POA: Diagnosis not present

## 2019-10-08 DIAGNOSIS — J310 Chronic rhinitis: Secondary | ICD-10-CM | POA: Diagnosis not present

## 2019-10-08 DIAGNOSIS — B342 Coronavirus infection, unspecified: Secondary | ICD-10-CM | POA: Diagnosis not present

## 2019-11-24 DIAGNOSIS — Z03818 Encounter for observation for suspected exposure to other biological agents ruled out: Secondary | ICD-10-CM | POA: Diagnosis not present

## 2019-12-10 DIAGNOSIS — R002 Palpitations: Secondary | ICD-10-CM | POA: Diagnosis not present

## 2019-12-10 DIAGNOSIS — Z6837 Body mass index (BMI) 37.0-37.9, adult: Secondary | ICD-10-CM | POA: Diagnosis not present

## 2019-12-10 DIAGNOSIS — E669 Obesity, unspecified: Secondary | ICD-10-CM | POA: Diagnosis not present

## 2020-01-05 DIAGNOSIS — F419 Anxiety disorder, unspecified: Secondary | ICD-10-CM | POA: Diagnosis not present

## 2020-01-26 DIAGNOSIS — M9901 Segmental and somatic dysfunction of cervical region: Secondary | ICD-10-CM | POA: Diagnosis not present

## 2020-01-26 DIAGNOSIS — M9905 Segmental and somatic dysfunction of pelvic region: Secondary | ICD-10-CM | POA: Diagnosis not present

## 2020-01-26 DIAGNOSIS — M25552 Pain in left hip: Secondary | ICD-10-CM | POA: Diagnosis not present

## 2020-01-26 DIAGNOSIS — M50322 Other cervical disc degeneration at C5-C6 level: Secondary | ICD-10-CM | POA: Diagnosis not present

## 2020-02-03 DIAGNOSIS — M9901 Segmental and somatic dysfunction of cervical region: Secondary | ICD-10-CM | POA: Diagnosis not present

## 2020-02-03 DIAGNOSIS — M25552 Pain in left hip: Secondary | ICD-10-CM | POA: Diagnosis not present

## 2020-02-03 DIAGNOSIS — M9905 Segmental and somatic dysfunction of pelvic region: Secondary | ICD-10-CM | POA: Diagnosis not present

## 2020-02-03 DIAGNOSIS — M50322 Other cervical disc degeneration at C5-C6 level: Secondary | ICD-10-CM | POA: Diagnosis not present

## 2020-02-04 DIAGNOSIS — M955 Acquired deformity of pelvis: Secondary | ICD-10-CM | POA: Diagnosis not present

## 2020-02-04 DIAGNOSIS — M47819 Spondylosis without myelopathy or radiculopathy, site unspecified: Secondary | ICD-10-CM | POA: Diagnosis not present

## 2020-02-12 DIAGNOSIS — E669 Obesity, unspecified: Secondary | ICD-10-CM | POA: Diagnosis not present

## 2020-02-12 DIAGNOSIS — Z6837 Body mass index (BMI) 37.0-37.9, adult: Secondary | ICD-10-CM | POA: Diagnosis not present

## 2020-02-16 DIAGNOSIS — M9901 Segmental and somatic dysfunction of cervical region: Secondary | ICD-10-CM | POA: Diagnosis not present

## 2020-02-16 DIAGNOSIS — M25552 Pain in left hip: Secondary | ICD-10-CM | POA: Diagnosis not present

## 2020-02-16 DIAGNOSIS — M9905 Segmental and somatic dysfunction of pelvic region: Secondary | ICD-10-CM | POA: Diagnosis not present

## 2020-02-16 DIAGNOSIS — M50322 Other cervical disc degeneration at C5-C6 level: Secondary | ICD-10-CM | POA: Diagnosis not present

## 2020-02-19 DIAGNOSIS — M25552 Pain in left hip: Secondary | ICD-10-CM | POA: Diagnosis not present

## 2020-02-19 DIAGNOSIS — M9901 Segmental and somatic dysfunction of cervical region: Secondary | ICD-10-CM | POA: Diagnosis not present

## 2020-02-19 DIAGNOSIS — M50322 Other cervical disc degeneration at C5-C6 level: Secondary | ICD-10-CM | POA: Diagnosis not present

## 2020-02-19 DIAGNOSIS — M9905 Segmental and somatic dysfunction of pelvic region: Secondary | ICD-10-CM | POA: Diagnosis not present

## 2020-02-23 DIAGNOSIS — H60502 Unspecified acute noninfective otitis externa, left ear: Secondary | ICD-10-CM | POA: Diagnosis not present

## 2020-02-24 DIAGNOSIS — M50322 Other cervical disc degeneration at C5-C6 level: Secondary | ICD-10-CM | POA: Diagnosis not present

## 2020-02-24 DIAGNOSIS — M9905 Segmental and somatic dysfunction of pelvic region: Secondary | ICD-10-CM | POA: Diagnosis not present

## 2020-02-24 DIAGNOSIS — M25552 Pain in left hip: Secondary | ICD-10-CM | POA: Diagnosis not present

## 2020-02-24 DIAGNOSIS — M9901 Segmental and somatic dysfunction of cervical region: Secondary | ICD-10-CM | POA: Diagnosis not present

## 2020-03-01 DIAGNOSIS — M25552 Pain in left hip: Secondary | ICD-10-CM | POA: Diagnosis not present

## 2020-03-01 DIAGNOSIS — M9901 Segmental and somatic dysfunction of cervical region: Secondary | ICD-10-CM | POA: Diagnosis not present

## 2020-03-01 DIAGNOSIS — M50322 Other cervical disc degeneration at C5-C6 level: Secondary | ICD-10-CM | POA: Diagnosis not present

## 2020-03-01 DIAGNOSIS — M9905 Segmental and somatic dysfunction of pelvic region: Secondary | ICD-10-CM | POA: Diagnosis not present

## 2020-03-08 DIAGNOSIS — M9901 Segmental and somatic dysfunction of cervical region: Secondary | ICD-10-CM | POA: Diagnosis not present

## 2020-03-08 DIAGNOSIS — M9905 Segmental and somatic dysfunction of pelvic region: Secondary | ICD-10-CM | POA: Diagnosis not present

## 2020-03-08 DIAGNOSIS — M50322 Other cervical disc degeneration at C5-C6 level: Secondary | ICD-10-CM | POA: Diagnosis not present

## 2020-03-08 DIAGNOSIS — M25552 Pain in left hip: Secondary | ICD-10-CM | POA: Diagnosis not present

## 2020-03-11 DIAGNOSIS — M25552 Pain in left hip: Secondary | ICD-10-CM | POA: Diagnosis not present

## 2020-03-11 DIAGNOSIS — M9905 Segmental and somatic dysfunction of pelvic region: Secondary | ICD-10-CM | POA: Diagnosis not present

## 2020-03-11 DIAGNOSIS — M50322 Other cervical disc degeneration at C5-C6 level: Secondary | ICD-10-CM | POA: Diagnosis not present

## 2020-03-11 DIAGNOSIS — M9901 Segmental and somatic dysfunction of cervical region: Secondary | ICD-10-CM | POA: Diagnosis not present

## 2020-03-16 DIAGNOSIS — M50322 Other cervical disc degeneration at C5-C6 level: Secondary | ICD-10-CM | POA: Diagnosis not present

## 2020-03-16 DIAGNOSIS — M9901 Segmental and somatic dysfunction of cervical region: Secondary | ICD-10-CM | POA: Diagnosis not present

## 2020-03-16 DIAGNOSIS — M25552 Pain in left hip: Secondary | ICD-10-CM | POA: Diagnosis not present

## 2020-03-16 DIAGNOSIS — M9905 Segmental and somatic dysfunction of pelvic region: Secondary | ICD-10-CM | POA: Diagnosis not present

## 2020-03-22 DIAGNOSIS — M50322 Other cervical disc degeneration at C5-C6 level: Secondary | ICD-10-CM | POA: Diagnosis not present

## 2020-03-22 DIAGNOSIS — M9901 Segmental and somatic dysfunction of cervical region: Secondary | ICD-10-CM | POA: Diagnosis not present

## 2020-03-22 DIAGNOSIS — M25552 Pain in left hip: Secondary | ICD-10-CM | POA: Diagnosis not present

## 2020-03-22 DIAGNOSIS — M9905 Segmental and somatic dysfunction of pelvic region: Secondary | ICD-10-CM | POA: Diagnosis not present

## 2020-03-30 DIAGNOSIS — M9901 Segmental and somatic dysfunction of cervical region: Secondary | ICD-10-CM | POA: Diagnosis not present

## 2020-03-30 DIAGNOSIS — M9905 Segmental and somatic dysfunction of pelvic region: Secondary | ICD-10-CM | POA: Diagnosis not present

## 2020-03-30 DIAGNOSIS — M50322 Other cervical disc degeneration at C5-C6 level: Secondary | ICD-10-CM | POA: Diagnosis not present

## 2020-03-30 DIAGNOSIS — M25552 Pain in left hip: Secondary | ICD-10-CM | POA: Diagnosis not present

## 2020-04-21 DIAGNOSIS — M9901 Segmental and somatic dysfunction of cervical region: Secondary | ICD-10-CM | POA: Diagnosis not present

## 2020-04-21 DIAGNOSIS — M25552 Pain in left hip: Secondary | ICD-10-CM | POA: Diagnosis not present

## 2020-04-21 DIAGNOSIS — M50322 Other cervical disc degeneration at C5-C6 level: Secondary | ICD-10-CM | POA: Diagnosis not present

## 2020-04-21 DIAGNOSIS — M9905 Segmental and somatic dysfunction of pelvic region: Secondary | ICD-10-CM | POA: Diagnosis not present

## 2020-05-11 DIAGNOSIS — M25561 Pain in right knee: Secondary | ICD-10-CM | POA: Diagnosis not present

## 2020-06-17 DIAGNOSIS — F419 Anxiety disorder, unspecified: Secondary | ICD-10-CM | POA: Diagnosis not present

## 2020-06-17 DIAGNOSIS — K219 Gastro-esophageal reflux disease without esophagitis: Secondary | ICD-10-CM | POA: Diagnosis not present

## 2020-06-17 DIAGNOSIS — J069 Acute upper respiratory infection, unspecified: Secondary | ICD-10-CM | POA: Diagnosis not present

## 2020-07-27 DIAGNOSIS — M25561 Pain in right knee: Secondary | ICD-10-CM | POA: Diagnosis not present

## 2020-07-29 DIAGNOSIS — Z13220 Encounter for screening for lipoid disorders: Secondary | ICD-10-CM | POA: Diagnosis not present

## 2020-07-29 DIAGNOSIS — Z23 Encounter for immunization: Secondary | ICD-10-CM | POA: Diagnosis not present

## 2020-07-29 DIAGNOSIS — F418 Other specified anxiety disorders: Secondary | ICD-10-CM | POA: Diagnosis not present

## 2020-07-29 DIAGNOSIS — Z0000 Encounter for general adult medical examination without abnormal findings: Secondary | ICD-10-CM | POA: Diagnosis not present

## 2020-08-10 DIAGNOSIS — S83221D Peripheral tear of medial meniscus, current injury, right knee, subsequent encounter: Secondary | ICD-10-CM | POA: Diagnosis not present

## 2020-08-18 DIAGNOSIS — Z20828 Contact with and (suspected) exposure to other viral communicable diseases: Secondary | ICD-10-CM | POA: Diagnosis not present

## 2020-09-01 DIAGNOSIS — Z20828 Contact with and (suspected) exposure to other viral communicable diseases: Secondary | ICD-10-CM | POA: Diagnosis not present

## 2020-12-04 NOTE — L&D Delivery Note (Signed)
Delivery Note At 5:39 PM a viable female was delivered via Vaginal, Spontaneous (Presentation: Left Occiput Anterior).  APGAR: 8, 9; weight  .   Placenta status: Spontaneous, Intact.  Cord: 3 vessels with the following complications: None.  Cord pH: n/a  Anesthesia: Epidural Episiotomy: None Lacerations: 2nd degree Suture Repair: 2.0 vicryl rapide Est. Blood Loss (mL):    Mom to postpartum.  Baby to Couplet care / Skin to Skin.  Lendon Colonel 10/22/2021, 6:00 PM

## 2021-01-04 DIAGNOSIS — Z124 Encounter for screening for malignant neoplasm of cervix: Secondary | ICD-10-CM | POA: Diagnosis not present

## 2021-01-04 DIAGNOSIS — Z01419 Encounter for gynecological examination (general) (routine) without abnormal findings: Secondary | ICD-10-CM | POA: Diagnosis not present

## 2021-01-04 DIAGNOSIS — Z6835 Body mass index (BMI) 35.0-35.9, adult: Secondary | ICD-10-CM | POA: Diagnosis not present

## 2021-01-14 DIAGNOSIS — Z3141 Encounter for fertility testing: Secondary | ICD-10-CM | POA: Diagnosis not present

## 2021-01-14 DIAGNOSIS — Z3202 Encounter for pregnancy test, result negative: Secondary | ICD-10-CM | POA: Diagnosis not present

## 2021-01-28 DIAGNOSIS — F419 Anxiety disorder, unspecified: Secondary | ICD-10-CM | POA: Diagnosis not present

## 2021-02-04 DIAGNOSIS — Z803 Family history of malignant neoplasm of breast: Secondary | ICD-10-CM | POA: Diagnosis not present

## 2021-02-04 DIAGNOSIS — N979 Female infertility, unspecified: Secondary | ICD-10-CM | POA: Diagnosis not present

## 2021-02-04 DIAGNOSIS — N803 Endometriosis of pelvic peritoneum: Secondary | ICD-10-CM | POA: Diagnosis not present

## 2021-02-04 DIAGNOSIS — Z3169 Encounter for other general counseling and advice on procreation: Secondary | ICD-10-CM | POA: Diagnosis not present

## 2021-02-04 DIAGNOSIS — Z36 Encounter for antenatal screening for chromosomal anomalies: Secondary | ICD-10-CM | POA: Diagnosis not present

## 2021-02-04 DIAGNOSIS — Z3201 Encounter for pregnancy test, result positive: Secondary | ICD-10-CM | POA: Diagnosis not present

## 2021-02-04 DIAGNOSIS — Z3200 Encounter for pregnancy test, result unknown: Secondary | ICD-10-CM | POA: Diagnosis not present

## 2021-02-08 DIAGNOSIS — N803 Endometriosis of pelvic peritoneum: Secondary | ICD-10-CM | POA: Diagnosis not present

## 2021-02-24 DIAGNOSIS — Z3201 Encounter for pregnancy test, result positive: Secondary | ICD-10-CM | POA: Diagnosis not present

## 2021-03-23 DIAGNOSIS — Z36 Encounter for antenatal screening for chromosomal anomalies: Secondary | ICD-10-CM | POA: Diagnosis not present

## 2021-03-23 DIAGNOSIS — Z3689 Encounter for other specified antenatal screening: Secondary | ICD-10-CM | POA: Diagnosis not present

## 2021-03-23 DIAGNOSIS — Z3401 Encounter for supervision of normal first pregnancy, first trimester: Secondary | ICD-10-CM | POA: Diagnosis not present

## 2021-03-23 LAB — OB RESULTS CONSOLE HIV ANTIBODY (ROUTINE TESTING): HIV: NONREACTIVE

## 2021-03-23 LAB — OB RESULTS CONSOLE RPR: RPR: NONREACTIVE

## 2021-03-23 LAB — OB RESULTS CONSOLE HEPATITIS B SURFACE ANTIGEN: Hepatitis B Surface Ag: NEGATIVE

## 2021-03-23 LAB — OB RESULTS CONSOLE RUBELLA ANTIBODY, IGM: Rubella: IMMUNE

## 2021-05-23 DIAGNOSIS — Z363 Encounter for antenatal screening for malformations: Secondary | ICD-10-CM | POA: Diagnosis not present

## 2021-05-23 DIAGNOSIS — Z361 Encounter for antenatal screening for raised alphafetoprotein level: Secondary | ICD-10-CM | POA: Diagnosis not present

## 2021-06-07 DIAGNOSIS — Z362 Encounter for other antenatal screening follow-up: Secondary | ICD-10-CM | POA: Diagnosis not present

## 2021-07-19 DIAGNOSIS — Z3689 Encounter for other specified antenatal screening: Secondary | ICD-10-CM | POA: Diagnosis not present

## 2021-07-27 DIAGNOSIS — M5489 Other dorsalgia: Secondary | ICD-10-CM | POA: Diagnosis not present

## 2021-07-28 DIAGNOSIS — R609 Edema, unspecified: Secondary | ICD-10-CM | POA: Diagnosis not present

## 2021-08-04 DIAGNOSIS — Z23 Encounter for immunization: Secondary | ICD-10-CM | POA: Diagnosis not present

## 2021-08-16 DIAGNOSIS — R350 Frequency of micturition: Secondary | ICD-10-CM | POA: Diagnosis not present

## 2021-08-16 DIAGNOSIS — Z3A31 31 weeks gestation of pregnancy: Secondary | ICD-10-CM | POA: Diagnosis not present

## 2021-08-16 DIAGNOSIS — O26893 Other specified pregnancy related conditions, third trimester: Secondary | ICD-10-CM | POA: Diagnosis not present

## 2021-08-31 ENCOUNTER — Inpatient Hospital Stay (HOSPITAL_COMMUNITY)
Admission: AD | Admit: 2021-08-31 | Discharge: 2021-08-31 | Disposition: A | Discharge status: Home / Self Care | Payer: BC Managed Care – PPO | Attending: Obstetrics & Gynecology | Admitting: Obstetrics & Gynecology

## 2021-08-31 ENCOUNTER — Encounter (HOSPITAL_COMMUNITY): Payer: Self-pay | Admitting: Obstetrics & Gynecology

## 2021-08-31 DIAGNOSIS — Z23 Encounter for immunization: Secondary | ICD-10-CM | POA: Diagnosis not present

## 2021-08-31 DIAGNOSIS — Y92410 Unspecified street and highway as the place of occurrence of the external cause: Secondary | ICD-10-CM | POA: Insufficient documentation

## 2021-08-31 DIAGNOSIS — O26893 Other specified pregnancy related conditions, third trimester: Secondary | ICD-10-CM | POA: Insufficient documentation

## 2021-08-31 DIAGNOSIS — Z3A33 33 weeks gestation of pregnancy: Secondary | ICD-10-CM | POA: Diagnosis not present

## 2021-08-31 DIAGNOSIS — O9A213 Injury, poisoning and certain other consequences of external causes complicating pregnancy, third trimester: Secondary | ICD-10-CM

## 2021-08-31 HISTORY — DX: Herpesviral infection, unspecified: B00.9

## 2021-08-31 HISTORY — DX: Depression, unspecified: F32.A

## 2021-08-31 HISTORY — DX: Endometriosis, unspecified: N80.9

## 2021-08-31 HISTORY — DX: Urinary tract infection, site not specified: N39.0

## 2021-08-31 HISTORY — DX: Anxiety disorder, unspecified: F41.9

## 2021-08-31 LAB — URINALYSIS, ROUTINE W REFLEX MICROSCOPIC
Bilirubin Urine: NEGATIVE
Glucose, UA: NEGATIVE mg/dL
Hgb urine dipstick: NEGATIVE
Ketones, ur: 20 mg/dL — AB
Leukocytes,Ua: NEGATIVE
Nitrite: NEGATIVE
Protein, ur: NEGATIVE mg/dL
Specific Gravity, Urine: 1.011 (ref 1.005–1.030)
pH: 5 (ref 5.0–8.0)
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
## 2021-09-13 ENCOUNTER — Institutional Professional Consult (permissible substitution): Payer: BLUE CROSS/BLUE SHIELD

## 2021-09-15 DIAGNOSIS — Z3685 Encounter for antenatal screening for Streptococcus B: Secondary | ICD-10-CM | POA: Diagnosis not present

## 2021-09-15 LAB — OB RESULTS CONSOLE GBS: GBS: NEGATIVE

## 2021-09-26 ENCOUNTER — Institutional Professional Consult (permissible substitution): Payer: Self-pay | Admitting: Pediatrics

## 2021-10-06 DIAGNOSIS — J069 Acute upper respiratory infection, unspecified: Secondary | ICD-10-CM | POA: Diagnosis not present

## 2021-10-07 ENCOUNTER — Ambulatory Visit (INDEPENDENT_AMBULATORY_CARE_PROVIDER_SITE_OTHER): Payer: Self-pay | Admitting: Pediatrics

## 2021-10-07 ENCOUNTER — Other Ambulatory Visit: Payer: Self-pay

## 2021-10-07 DIAGNOSIS — Z7681 Expectant parent(s) prebirth pediatrician visit: Secondary | ICD-10-CM

## 2021-10-08 DIAGNOSIS — Z7681 Expectant parent(s) prebirth pediatrician visit: Secondary | ICD-10-CM | POA: Insufficient documentation

## 2021-10-08 NOTE — Progress Notes (Signed)
Prenatal counseling for impending newborn done. Mother agrees with vaccine policy Z76.81  

## 2021-10-17 DIAGNOSIS — Z3A40 40 weeks gestation of pregnancy: Secondary | ICD-10-CM | POA: Diagnosis not present

## 2021-10-17 DIAGNOSIS — O26849 Uterine size-date discrepancy, unspecified trimester: Secondary | ICD-10-CM | POA: Diagnosis not present

## 2021-10-18 ENCOUNTER — Other Ambulatory Visit: Payer: Self-pay | Admitting: Obstetrics

## 2021-10-18 LAB — SARS CORONAVIRUS 2 (TAT 6-24 HRS): SARS Coronavirus 2: NEGATIVE

## 2021-10-19 ENCOUNTER — Other Ambulatory Visit: Payer: Self-pay | Admitting: Obstetrics

## 2021-10-20 NOTE — H&P (Signed)
Anna Oconnor is a 35 y.o. G1P0 at [redacted]w[redacted]d presenting for post-dates IOL. Pt notes occasional contractions . Good fetal movement, No vaginal bleeding, not leaking fluid.  PNCare at [facility] since 6 wks - Dated by LMP c/w 6 wks u/s  - h/o endometriosis -/ h/o HSV, On Valtrex at end of preg - Covid infection in pregnancy - fetal growth 40'4" 7'12/ 34%, AFI 7/ 11%   Prenatal Transfer Tool  Maternal Diabetes: No Genetic Screening: Normal Maternal Ultrasounds/Referrals: Normal Fetal Ultrasounds or other Referrals:  None Maternal Substance Abuse:  No Significant Maternal Medications:  None Significant Maternal Lab Results: Group B Strep negative     OB History     Gravida  1   Para      Term      Preterm      AB      Living         SAB      IAB      Ectopic      Multiple      Live Births             Past Medical History:  Diagnosis Date   Anxiety    Depression    Endometriosis    Herpes    rare outbreaks   UTI (urinary tract infection)    Past Surgical History:  Procedure Laterality Date   ADENOIDECTOMY     ANKLE SURGERY Right    crushed cartilage and stretched ligaments   ANTERIOR CRUCIATE LIGAMENT REPAIR Right    x2   DG THUMB LEFT HAND     pin placed and removed   EXPLORATORY LAPAROTOMY     for endometriosis   RHINOPLASTY     TONSILLECTOMY     Family History: family history includes Cancer in her father; Healthy in her mother; Pulmonary embolism in her father. Social History:  reports that she has quit smoking. Her smoking use included cigarettes. She has never used smokeless tobacco. She reports that she does not currently use alcohol. She reports that she does not currently use drugs after having used the following drugs: Marijuana.  Review of Systems - Negative except discomfort of pregnancy  Vitals:   10/22/21 0400 10/22/21 0401 10/22/21 0501 10/22/21 0601  BP: 129/69 129/69 132/90 138/83  Pulse: 77 77 91 86  Resp: 17     Temp:       TempSrc:      Weight:      Height:        Physical Exam:  Gen: well appearing, no distress  Back: no CVAT Abd: gravid, NT, no RUQ pain LE: no edema, equal bilaterally, non-tender Toco: none FH: baseline 120s, accelerations present, no deceleratons, 10 beat variability  Prenatal labs: ABO, Rh:  O+ Antibody:  neg Rubella:  RI RPR:   NR HBsAg:   neg HIV:   neg GBS:   neg 1 hr Glucola 109  Genetic screening nl Panorama, nl AFP Anatomy US normal   Assessment/Plan: 35 y.o. G1P0 at [redacted]w[redacted]d - IOL, post EDC, cytotec x 2 then pitocin 2x2, foley if needed GBS neg   Lendon Colonel 10/20/2021, 10:16 PM  Admitted 2 am on 11/19, just placed 2nd cytotec, minimal cramping, reactive fetal testing  Lendon Colonel 10/22/2021 6:52 AM

## 2021-10-21 ENCOUNTER — Inpatient Hospital Stay (HOSPITAL_COMMUNITY): Payer: BC Managed Care – PPO

## 2021-10-22 ENCOUNTER — Inpatient Hospital Stay (HOSPITAL_COMMUNITY): Payer: BC Managed Care – PPO | Admitting: Anesthesiology

## 2021-10-22 ENCOUNTER — Other Ambulatory Visit: Payer: Self-pay

## 2021-10-22 ENCOUNTER — Encounter (HOSPITAL_COMMUNITY): Payer: Self-pay | Admitting: Obstetrics

## 2021-10-22 ENCOUNTER — Inpatient Hospital Stay (HOSPITAL_COMMUNITY)
Admission: AD | Admit: 2021-10-22 | Discharge: 2021-10-24 | DRG: 806 | Disposition: A | Discharge status: Home / Self Care | Payer: BC Managed Care – PPO | Attending: Obstetrics | Admitting: Obstetrics

## 2021-10-22 DIAGNOSIS — Z3A41 41 weeks gestation of pregnancy: Secondary | ICD-10-CM | POA: Diagnosis not present

## 2021-10-22 DIAGNOSIS — O99214 Obesity complicating childbirth: Secondary | ICD-10-CM | POA: Diagnosis not present

## 2021-10-22 DIAGNOSIS — A6000 Herpesviral infection of urogenital system, unspecified: Secondary | ICD-10-CM | POA: Diagnosis present

## 2021-10-22 DIAGNOSIS — O9832 Other infections with a predominantly sexual mode of transmission complicating childbirth: Secondary | ICD-10-CM | POA: Diagnosis present

## 2021-10-22 DIAGNOSIS — O480 Post-term pregnancy: Secondary | ICD-10-CM | POA: Diagnosis not present

## 2021-10-22 DIAGNOSIS — Z8616 Personal history of COVID-19: Secondary | ICD-10-CM | POA: Diagnosis not present

## 2021-10-22 DIAGNOSIS — Z87891 Personal history of nicotine dependence: Secondary | ICD-10-CM

## 2021-10-22 DIAGNOSIS — Z23 Encounter for immunization: Secondary | ICD-10-CM | POA: Diagnosis not present

## 2021-10-22 DIAGNOSIS — Z3490 Encounter for supervision of normal pregnancy, unspecified, unspecified trimester: Principal | ICD-10-CM | POA: Diagnosis present

## 2021-10-22 LAB — CBC
HCT: 37.2 % (ref 36.0–46.0)
Hemoglobin: 11.9 g/dL — ABNORMAL LOW (ref 12.0–15.0)
MCH: 29 pg (ref 26.0–34.0)
MCHC: 32 g/dL (ref 30.0–36.0)
MCV: 90.5 fL (ref 80.0–100.0)
Platelets: 224 10*3/uL (ref 150–400)
RBC: 4.11 MIL/uL (ref 3.87–5.11)
RDW: 14.1 % (ref 11.5–15.5)
WBC: 14 10*3/uL — ABNORMAL HIGH (ref 4.0–10.5)
nRBC: 0 % (ref 0.0–0.2)

## 2021-10-22 LAB — TYPE AND SCREEN
ABO/RH(D): O POS
Antibody Screen: NEGATIVE

## 2021-10-22 LAB — RPR
RPR Ser Ql: REACTIVE — AB
RPR Titer: 1:1 {titer}

## 2021-10-22 MED ORDER — ONDANSETRON HCL 4 MG PO TABS: 4.0000 mg | ORAL_TABLET | ORAL | Status: DC | PRN

## 2021-10-22 MED ORDER — TETANUS-DIPHTH-ACELL PERTUSSIS 5-2.5-18.5 LF-MCG/0.5 IM SUSY: 0.5000 mL | PREFILLED_SYRINGE | Freq: Once | INTRAMUSCULAR | Status: DC

## 2021-10-22 MED ORDER — ACETAMINOPHEN 325 MG PO TABS: 650.0000 mg | ORAL_TABLET | ORAL | Status: DC | PRN

## 2021-10-22 MED ORDER — ONDANSETRON HCL 4 MG/2ML IJ SOLN
4.0000 mg | Freq: Four times a day (QID) | INTRAMUSCULAR | Status: DC | PRN
  Administered 2021-10-22: 4 mg via INTRAVENOUS
  Filled 2021-10-22: qty 2

## 2021-10-22 MED ORDER — FENTANYL-BUPIVACAINE-NACL 0.5-0.125-0.9 MG/250ML-% EP SOLN
12.0000 mL/h | EPIDURAL | Status: DC | PRN
  Administered 2021-10-22: 12 mL/h via EPIDURAL
  Filled 2021-10-22: qty 250

## 2021-10-22 MED ORDER — LIDOCAINE HCL (PF) 1 % IJ SOLN: 30.0000 mL | INTRAMUSCULAR | Status: DC | PRN

## 2021-10-22 MED ORDER — SOD CITRATE-CITRIC ACID 500-334 MG/5ML PO SOLN: 30.0000 mL | ORAL | Status: DC | PRN

## 2021-10-22 MED ORDER — MISOPROSTOL 25 MCG QUARTER TABLET
25.0000 ug | ORAL_TABLET | ORAL | Status: AC | PRN
  Administered 2021-10-22: 25 ug via VAGINAL
  Filled 2021-10-22: qty 1

## 2021-10-22 MED ORDER — METOCLOPRAMIDE HCL 5 MG/ML IJ SOLN
10.0000 mg | Freq: Once | INTRAMUSCULAR | Status: AC
  Administered 2021-10-22: 10 mg via INTRAVENOUS
  Filled 2021-10-22: qty 2

## 2021-10-22 MED ORDER — ONDANSETRON HCL 4 MG/2ML IJ SOLN: 4.0000 mg | INTRAMUSCULAR | Status: DC | PRN

## 2021-10-22 MED ORDER — OXYTOCIN-SODIUM CHLORIDE 30-0.9 UT/500ML-% IV SOLN
2.5000 [IU]/h | INTRAVENOUS | Status: DC
  Administered 2021-10-22: 2.5 [IU]/h via INTRAVENOUS

## 2021-10-22 MED ORDER — DIPHENHYDRAMINE HCL 50 MG/ML IJ SOLN: 12.5000 mg | INTRAMUSCULAR | Status: DC | PRN

## 2021-10-22 MED ORDER — FENTANYL CITRATE (PF) 100 MCG/2ML IJ SOLN
100.0000 ug | INTRAMUSCULAR | Status: DC | PRN
  Administered 2021-10-22: 100 ug via INTRAVENOUS
  Filled 2021-10-22: qty 2

## 2021-10-22 MED ORDER — SENNOSIDES-DOCUSATE SODIUM 8.6-50 MG PO TABS
2.0000 | ORAL_TABLET | ORAL | Status: DC
  Administered 2021-10-22 – 2021-10-23 (×2): 2 via ORAL
  Filled 2021-10-22 (×2): qty 2

## 2021-10-22 MED ORDER — DIBUCAINE (PERIANAL) 1 % EX OINT: 1.0000 "application " | TOPICAL_OINTMENT | CUTANEOUS | Status: DC | PRN

## 2021-10-22 MED ORDER — LACTATED RINGERS IV SOLN: 500.0000 mL | Freq: Once | INTRAVENOUS | Status: DC

## 2021-10-22 MED ORDER — LACTATED RINGERS IV SOLN: INTRAVENOUS | Status: DC

## 2021-10-22 MED ORDER — PRENATAL MULTIVITAMIN CH
1.0000 | ORAL_TABLET | Freq: Every day | ORAL | Status: DC
  Administered 2021-10-23 – 2021-10-24 (×2): 1 via ORAL
  Filled 2021-10-22 (×2): qty 1

## 2021-10-22 MED ORDER — ZOLPIDEM TARTRATE 5 MG PO TABS: 5.0000 mg | ORAL_TABLET | Freq: Every evening | ORAL | Status: DC | PRN

## 2021-10-22 MED ORDER — PHENYLEPHRINE 40 MCG/ML (10ML) SYRINGE FOR IV PUSH (FOR BLOOD PRESSURE SUPPORT): 80.0000 ug | PREFILLED_SYRINGE | INTRAVENOUS | Status: DC | PRN

## 2021-10-22 MED ORDER — OXYTOCIN-SODIUM CHLORIDE 30-0.9 UT/500ML-% IV SOLN
1.0000 m[IU]/min | INTRAVENOUS | Status: DC
  Administered 2021-10-22 (×3): 2 m[IU]/min via INTRAVENOUS
  Filled 2021-10-22: qty 500

## 2021-10-22 MED ORDER — TERBUTALINE SULFATE 1 MG/ML IJ SOLN
0.2500 mg | Freq: Once | INTRAMUSCULAR | Status: AC | PRN
  Administered 2021-10-22: 0.25 mg via SUBCUTANEOUS
  Filled 2021-10-22: qty 1

## 2021-10-22 MED ORDER — LIDOCAINE-EPINEPHRINE (PF) 2 %-1:200000 IJ SOLN
INTRAMUSCULAR | Status: DC | PRN
  Administered 2021-10-22: 5 mL via EPIDURAL

## 2021-10-22 MED ORDER — COCONUT OIL OIL: 1.0000 "application " | TOPICAL_OIL | Status: DC | PRN

## 2021-10-22 MED ORDER — LACTATED RINGERS IV SOLN
500.0000 mL | INTRAVENOUS | Status: DC | PRN
  Administered 2021-10-22: 1000 mL via INTRAVENOUS

## 2021-10-22 MED ORDER — BENZOCAINE-MENTHOL 20-0.5 % EX AERO
1.0000 "application " | INHALATION_SPRAY | CUTANEOUS | Status: DC | PRN
  Administered 2021-10-22: 1 via TOPICAL
  Filled 2021-10-22: qty 56

## 2021-10-22 MED ORDER — IBUPROFEN 600 MG PO TABS
600.0000 mg | ORAL_TABLET | Freq: Four times a day (QID) | ORAL | Status: DC
  Administered 2021-10-22 – 2021-10-24 (×7): 600 mg via ORAL
  Filled 2021-10-22 (×7): qty 1

## 2021-10-22 MED ORDER — OXYCODONE HCL 5 MG PO TABS: 10.0000 mg | ORAL_TABLET | ORAL | Status: DC | PRN

## 2021-10-22 MED ORDER — DIPHENHYDRAMINE HCL 25 MG PO CAPS: 25.0000 mg | ORAL_CAPSULE | Freq: Four times a day (QID) | ORAL | Status: DC | PRN

## 2021-10-22 MED ORDER — OXYCODONE HCL 5 MG PO TABS: 5.0000 mg | ORAL_TABLET | ORAL | Status: DC | PRN

## 2021-10-22 MED ORDER — SIMETHICONE 80 MG PO CHEW: 80.0000 mg | CHEWABLE_TABLET | ORAL | Status: DC | PRN

## 2021-10-22 MED ORDER — MISOPROSTOL 25 MCG QUARTER TABLET
ORAL_TABLET | ORAL | Status: AC
  Administered 2021-10-22: 25 ug via VAGINAL
  Filled 2021-10-22: qty 1

## 2021-10-22 MED ORDER — WITCH HAZEL-GLYCERIN EX PADS: 1.0000 "application " | MEDICATED_PAD | CUTANEOUS | Status: DC | PRN

## 2021-10-22 MED ORDER — EPHEDRINE 5 MG/ML INJ: 10.0000 mg | INTRAVENOUS | Status: DC | PRN

## 2021-10-22 MED ORDER — OXYTOCIN BOLUS FROM INFUSION
333.0000 mL | Freq: Once | INTRAVENOUS | Status: AC
  Administered 2021-10-22: 333 mL via INTRAVENOUS

## 2021-10-22 NOTE — Anesthesia Preprocedure Evaluation (Signed)
Anesthesia Evaluation  Patient identified by MRN, date of birth, ID band Patient awake    Reviewed: Allergy & Precautions, NPO status , Patient's Chart, lab work & pertinent test results  Airway Mallampati: III  TM Distance: >3 FB Neck ROM: Full    Dental no notable dental hx.    Pulmonary neg pulmonary ROS, former smoker,    Pulmonary exam normal breath sounds clear to auscultation       Cardiovascular negative cardio ROS Normal cardiovascular exam Rhythm:Regular Rate:Normal     Neuro/Psych PSYCHIATRIC DISORDERS Anxiety Depression negative neurological ROS     GI/Hepatic negative GI ROS, Neg liver ROS,   Endo/Other  Morbid obesity (BMI 42)  Renal/GU negative Renal ROS  negative genitourinary   Musculoskeletal negative musculoskeletal ROS (+)   Abdominal (+) + obese,   Peds  Hematology negative hematology ROS (+)   Anesthesia Other Findings   Reproductive/Obstetrics (+) Pregnancy                             Anesthesia Physical Anesthesia Plan  ASA: 3  Anesthesia Plan: Epidural   Post-op Pain Management:    Induction:   PONV Risk Score and Plan: Treatment may vary due to age or medical condition  Airway Management Planned: Natural Airway  Additional Equipment:   Intra-op Plan:   Post-operative Plan:   Informed Consent: I have reviewed the patients History and Physical, chart, labs and discussed the procedure including the risks, benefits and alternatives for the proposed anesthesia with the patient or authorized representative who has indicated his/her understanding and acceptance.       Plan Discussed with: Anesthesiologist  Anesthesia Plan Comments: (Patient identified. Risks, benefits, options discussed with patient including but not limited to bleeding, infection, nerve damage, paralysis, failed block, incomplete pain control, headache, blood pressure changes,  nausea, vomiting, reactions to medication, itching, and post partum back pain. Confirmed with bedside nurse the patient's most recent platelet count. Confirmed with the patient that they are not taking any anticoagulation, have any bleeding history or any family history of bleeding disorders. Patient expressed understanding and wishes to proceed. All questions were answered. )        Anesthesia Quick Evaluation

## 2021-10-22 NOTE — Progress Notes (Signed)
CTSP for reccurent late decels  Pt started contracting at end of 2nd cytotec, cvx 2/50%. Single prolonged decels with BM attempt while sitting on toilet. After recovery pitocin started.  Pt notes worsening contraction pain, not relived with Fentanyl. Nausea, not relieved with ZOfran or Reglan.  Nurse called for recurrent late decels, pit stopped but still ctx q 3 min, terb ordered, IV fluid bouls begun  O: Vitals:   10/22/21 0732 10/22/21 0831 10/22/21 0934 10/22/21 1133  BP: 123/76 (!) 113/54 125/63 128/72  Pulse: 81 74 83 71  Resp: 16 19 20 16   Temp: 97.8 F (36.6 C)     TempSrc: Oral     Weight:      Height:       GenL uncomfortable, moved from l lateral to hand and kneed GU:6/90/0. FSE attempted but not well applied, moved back to external monitorin Toco: slowing to q 5 FH: 120s,. + accels, 10 beat var, single variable decels in last 30 min  A/P: IOL post-dates 41+ Active labor after cytotec x 2, will allow pt to labor on own after terbutaline wears off Recc epidural now Recurrent late decels likely due to rapid active change. Will cont to watch closely. Pt aware risks of c/s  10/22/2021 12:23 PM

## 2021-10-22 NOTE — Plan of Care (Signed)

## 2021-10-22 NOTE — Lactation Note (Signed)
This note was copied from a baby's chart. Lactation Consultation Note  Patient Name: Girl Makiyah Zentz OMAYO'K Date: 10/22/2021 Reason for consult: L&D Initial assessment;Primapara;Term Age:34 hours  Infant cueing to feed.  LC assisted in laid back position.  Infant latched after a few attempts.  Gape was wide and rhythmic sucking noted throughout feeding.    Parents had a few questions and all were addressed.  Parents understand LC will follow up on MBU.  Maternal Data Has patient been taught Hand Expression?: Yes Does the patient have breastfeeding experience prior to this delivery?: No  Feeding Mother's Current Feeding Choice: Breast Milk  LATCH Score Latch: Grasps breast easily, tongue down, lips flanged, rhythmical sucking.  Audible Swallowing: A few with stimulation  Type of Nipple: Everted at rest and after stimulation  Comfort (Breast/Nipple): Soft / non-tender  Hold (Positioning): Assistance needed to correctly position infant at breast and maintain latch.  LATCH Score: 8   Lactation Tools Discussed/Used    Interventions Interventions: Assisted with latch;Skin to skin  Discharge    Consult Status Consult Status: Follow-up from L&D    Maryruth Hancock [facility] 10/22/2021, 8:06 PM

## 2021-10-22 NOTE — Progress Notes (Signed)
Terb 0.25 sg given

## 2021-10-22 NOTE — Progress Notes (Signed)
Pt on bedpan attempting to void

## 2021-10-22 NOTE — Progress Notes (Signed)
Reglan 10 mg IVP given.

## 2021-10-22 NOTE — Plan of Care (Signed)
Pt demonstrated understanding 

## 2021-10-22 NOTE — Anesthesia Procedure Notes (Signed)
Epidural Patient location during procedure: OB Start time: 10/22/2021 12:30 PM End time: 10/22/2021 12:40 PM  Staffing Anesthesiologist: Elmer Picker, MD Performed: anesthesiologist   Preanesthetic Checklist Completed: patient identified, IV checked, risks and benefits discussed, monitors and equipment checked, pre-op evaluation and timeout performed  Epidural Patient position: sitting Prep: DuraPrep and site prepped and draped Patient monitoring: continuous pulse ox, blood pressure, heart rate and cardiac monitor Approach: midline Location: L3-L4 Injection technique: LOR air  Needle:  Needle type: Tuohy  Needle gauge: 17 G Needle length: 9 cm Needle insertion depth: 5.5 cm Catheter type: closed end flexible Catheter size: 19 Gauge Catheter at skin depth: 11 cm Test dose: negative  Assessment Sensory level: T8 Events: blood not aspirated, injection not painful, no injection resistance, no paresthesia and negative IV test  Additional Notes Patient identified. Risks/Benefits/Options discussed with patient including but not limited to bleeding, infection, nerve damage, paralysis, failed block, incomplete pain control, headache, blood pressure changes, nausea, vomiting, reactions to medication both or allergic, itching and postpartum back pain. Confirmed with bedside nurse the patient's most recent platelet count. Confirmed with patient that they are not currently taking any anticoagulation, have any bleeding history or any family history of bleeding disorders. Patient expressed understanding and wished to proceed. All questions were answered. Sterile technique was used throughout the entire procedure. Please see nursing notes for vital signs. Test dose was given through epidural catheter and negative prior to continuing to dose epidural or start infusion. Warning signs of high block given to the patient including shortness of breath, tingling/numbness in hands, complete motor  block, or any concerning symptoms with instructions to call for help. Patient was given instructions on fall risk and not to get out of bed. All questions and concerns addressed with instructions to call with any issues or inadequate analgesia.  Reason for block:procedure for pain

## 2021-10-23 NOTE — Anesthesia Postprocedure Evaluation (Signed)
Anesthesia Post Note  Patient: Anna Oconnor  Procedure(s) Performed: AN AD HOC LABOR EPIDURAL     Patient location during evaluation: Mother Baby Anesthesia Type: Epidural Level of consciousness: awake Pain management: satisfactory to patient Vital Signs Assessment: post-procedure vital signs reviewed and stable Respiratory status: spontaneous breathing Cardiovascular status: stable Anesthetic complications: no   No notable events documented.  Last Vitals:  Vitals:   10/23/21 0151 10/23/21 0615  BP: 109/63 127/84  Pulse: 92 84  Resp: 17 18  Temp: 37.2 C 37.1 C  SpO2: 98% 100%    Last Pain:  Vitals:   10/23/21 0615  TempSrc: Oral  PainSc: 4    Pain Goal:                   Cephus Shelling

## 2021-10-23 NOTE — Lactation Note (Signed)
This note was copied from a baby's chart. Lactation Consultation Note  Patient Name: Anna Oconnor Today's Date: 10/23/2021 Reason for consult: Initial assessment;Term;Primapara;1st time breastfeeding Age:34 hours   P1 mother whose infant is now 34 hours old.  This is a term baby at 41+2 weeks.  RN requested latch assistance.  Arrived to find baby in mother's arms and crying; mother frustrated.  Mother informed me that she just finished feeding for 40 minutes and her nipples were sore.  Offered to assist with latching and mother receptive.  Taught hand expression and finger fed colostrum to baby.  Initially, upon my gloved finger, baby had a "biting and chomping" suck; lips tight.  Suck training performed.  Assisted to latch in the cross cradle hold.  Mother denied pain and stated the latch was "better" than the previous feeding.  Demonstrated breast compression and gentle stimulation.  Observed "[name]" feeding for 16 minutes.  Swaddled and placed in the bassinet after feeding.    Mother will feed 8-12 times/24 hours or sooner if baby shows cues.  She will call for latch assistance as needed.  Mother has a DEBP for home use and plans to pump after discharge so father can assist with feedings.    Mom made aware of O/P services, breastfeeding support groups, community resources, and our phone # for post-discharge questions.  Father supportive and assisting mother with care.   Maternal Data Has patient been taught Hand Expression?: Yes Does the patient have breastfeeding experience prior to this delivery?: No  Feeding Mother's Current Feeding Choice: Breast Milk  LATCH Score Latch: Grasps breast easily, tongue down, lips flanged, rhythmical sucking.  Audible Swallowing: A few with stimulation  Type of Nipple: Everted at rest and after stimulation (short shafted)  Comfort (Breast/Nipple): Soft / non-tender  Hold (Positioning): Assistance needed to correctly position infant at  breast and maintain latch.  LATCH Score: 8   Lactation Tools Discussed/Used Tools: Coconut oil  Interventions Interventions: Breast feeding basics reviewed;Assisted with latch;Breast massage;Skin to skin;Hand express;Breast compression;Coconut oil;Position options;Support pillows;Adjust position;Education  Discharge    Consult Status Consult Status: Follow-up Date: 10/24/21 Follow-up type: In-patient    Branna Cortina R Adley Mazurowski 10/23/2021, 5:24 AM

## 2021-10-23 NOTE — Lactation Note (Signed)
This note was copied from a baby's chart. Lactation Consultation Note  Patient Name: Anna Oconnor Date: 10/23/2021 Reason for consult: Follow-up assessment;Primapara;Term;1st time breastfeeding Age:34 hours   LC Follow Up Consult:  RN requested lactation consult.  Mother is exhausted and baby "[name]" has not been satisfied to lay quietly in her bassinet.  When I arrived mother commented that she was finally able to feed her a little bit more and place her in the bassinet.  Emotional support and reassurance provided.  Offered to place a "quiet sign" on her door and mother very grateful.  Father present and asleep on the couch.  Offered to return as needed for latch assistance.  RN updated and sign placed on door.   Maternal Data    Feeding Mother's Current Feeding Choice: Breast Milk  LATCH Score Latch: Repeated attempts needed to sustain latch, nipple held in mouth throughout feeding, stimulation needed to elicit sucking reflex.  Audible Swallowing: A few with stimulation  Type of Nipple: Everted at rest and after stimulation  Comfort (Breast/Nipple): Filling, red/small blisters or bruises, mild/mod discomfort  Hold (Positioning): Assistance needed to correctly position infant at breast and maintain latch.  LATCH Score: 6   Lactation Tools Discussed/Used    Interventions    Discharge    Consult Status Consult Status: Follow-up Date: 10/24/21 Follow-up type: In-patient    Lessie Manigo R Aliviah Spain 10/23/2021, 11:25 AM

## 2021-10-23 NOTE — Progress Notes (Signed)
CSW received consult for hx of marijuana use.  Referral was screened out due to the following: ~MOB had no documented substance use after initial prenatal visit/+UPT. ~MOB had no positive drug screens after initial prenatal visit/+UPT. ~Baby's UDS is negative. CSW will monitor CDS results and make report to Child Protective Services if warranted.  MOB was referred for history of depression/anxiety. * Referral screened out by Clinical Social Worker because none of the following criteria appear to apply: ~ History of anxiety/depression during this pregnancy, or of post-partum depression. ~ Diagnosis of anxiety and/or depression within last 3 years OR * MOB's symptoms currently being treated with medication and/or therapy.  Please contact the Clinical Social Worker if needs arise, or if MOB requests.   Edinburgh Score is 6.  Blaine Hamper, MSW, LCSW Clinical Social Work [phone]

## 2021-10-23 NOTE — Progress Notes (Signed)
Post Partum Day 1 S/P induced vaginal  Feeding: breast Subjective: No HA, SOB, CP, F/C, breast symptoms. Normal vaginal bleeding, no clots. Pain controlled.  ambulating without symptoms.  voiding without difficulty    Objective: BP 123/75 (BP Location: Right Arm)   Pulse 93   Temp 98 F (36.7 C) (Oral)   Resp 17   Ht 5\' 7"  (1.702 m)   Wt 121.1 kg   SpO2 98%   Breastfeeding Unknown   BMI 41.82 kg/m  I&O reviewed.   Physical Exam:  General: alert and cooperative Lochia: appropriate Uterine Fundus: firm DVT Evaluation: No evidence of DVT seen on physical exam. Ext: No c/c/e Recent Labs    10/22/21 0217  HGB 11.9*  HCT 37.2      Assessment/Plan: 35 y.o.  PPD #1 .  normal postpartum exam Continue current postpartum care Ambulate D/c home tomorrow   LOS: 1 day   20 10/23/2021 11:58 AM

## 2021-10-24 MED ORDER — IBUPROFEN 600 MG PO TABS: 600.0000 mg | ORAL_TABLET | Freq: Four times a day (QID) | ORAL | 0 refills | Status: AC

## 2021-10-24 NOTE — Progress Notes (Signed)
No c/o; nml lochia Breastfeeding; voids w/o difficulty; tol po   Patient Vitals for the past 24 hrs:  BP Temp Temp src Pulse Resp SpO2  10/24/21 0542 120/77 98.4 F (36.9 C) Oral 75 20 99 %  10/23/21 2128 117/80 98 F (36.7 C) Oral 80 18 100 %  10/23/21 1542 119/78 98.2 F (36.8 C) Oral 97 18 --  10/23/21 0834 123/75 98 F (36.7 C) Oral 93 17 98 %   A&Ox3 Nml respirations Abd: sfot,nt,nd; fundus firm and below umb LE: no edema,nt bilat  CBC Latest Ref Rng & Units 10/22/2021  WBC 4.0 - 10.5 K/uL 14.0(H)  Hemoglobin 12.0 - 15.0 g/dL 11.9(L)  Hematocrit 36.0 - 46.0 % 37.2  Platelets 150 - 400 K/uL 224   A/P: ppd2 s/p svd Doing well, d/c home; f/u in 6 wks Rh pos RI Breastfeeding, girl 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

## 2021-10-24 NOTE — Discharge Summary (Signed)
Postpartum Discharge Summary  Date of Service updated     Patient Name: Anna Oconnor DOB: 1986-10-23 MRN: [id number]  Date of admission: 10/22/2021 Delivery date:10/22/2021  Delivering provider: Aloha Gell  Date of discharge: 10/24/2021  Admitting diagnosis: Encounter for induction of labor [Z34.90] Intrauterine pregnancy: [redacted]w[redacted]d    Secondary diagnosis:  Principal Problem:   Postpartum care following vaginal delivery 11/19 Active Problems:   Encounter for induction of labor   SVD (spontaneous vaginal delivery)   Perineal laceration, second degree  Additional problems: none    Discharge diagnosis: Term Pregnancy Delivered                                              Post partum procedures: none Augmentation: AROM, Pitocin, and Cytotec Complications: None  Hospital course: Induction of Labor With Vaginal Delivery   35y.o. yo G1P1001 at 449w2das admitted to the hospital 10/22/2021 for induction of labor.  Indication for induction: Postdates.  Patient had an uncomplicated labor course as follows: Membrane Rupture Time/Date: 11:52 AM ,10/22/2021   Delivery Method:Vaginal, Spontaneous  Episiotomy: None  Lacerations:  2nd degree  Details of delivery can be found in separate delivery note.  Patient had a routine postpartum course. Patient is discharged home 10/24/21.  Newborn Data: Birth date:10/22/2021  Birth time:5:39 PM  Gender:Female  Living status:Living  Apgars:8 ,9  Weight:3230 g   Magnesium Sulfate received: No BMZ received: No Rhophylac:No MMR:No T-DaP:Given prenatally Flu: No Transfusion:No  Physical exam  Vitals:   10/23/21 0834 10/23/21 1542 10/23/21 2128 10/24/21 0542  BP: 123/75 119/78 117/80 120/77  Pulse: 93 97 80 75  Resp: 17 18 18 20   Temp: 98 F (36.7 C) 98.2 F (36.8 C) 98 F (36.7 C) 98.4 F (36.9 C)  TempSrc: Oral Oral Oral Oral  SpO2: 98%  100% 99%  Weight:      Height:       General: alert, cooperative, and no  distress Lochia: appropriate Uterine Fundus: firm Incision: Healing well with no significant drainage DVT Evaluation: No evidence of DVT seen on physical exam. Labs: Lab Results  Component Value Date   WBC 14.0 (H) 10/22/2021   HGB 11.9 (L) 10/22/2021   HCT 37.2 10/22/2021   MCV 90.5 10/22/2021   PLT 224 10/22/2021   No flowsheet data found. Edinburgh Score: Edinburgh Postnatal Depression Scale Screening Tool 10/22/2021  I have been able to laugh and see the funny side of things. 0  I have looked forward with enjoyment to things. 0  I have blamed myself unnecessarily when things went wrong. 1  I have been anxious or worried for no good reason. 1  I have felt scared or panicky for no good reason. 1  Things have been getting on top of me. 1  I have been so unhappy that I have had difficulty sleeping. 0  I have felt sad or miserable. 1  I have been so unhappy that I have been crying. 1  The thought of harming myself has occurred to me. 0  Edinburgh Postnatal Depression Scale Total 6      After visit meds:  Allergies as of 10/24/2021       Reactions   Prednisone Rash        Medication List     STOP taking these medications    diphenhydrAMINE 25 MG  tablet Commonly known as: BENADRYL   doxylamine (Sleep) 25 MG tablet Commonly known as: UNISOM       TAKE these medications    cetirizine 10 MG tablet Commonly known as: ZYRTEC Take 10 mg by mouth daily.   ibuprofen 600 MG tablet Commonly known as: ADVIL Take 1 tablet (600 mg total) by mouth every 6 (six) hours.   levocetirizine 5 MG tablet Commonly known as: XYZAL TAKE 1 TABLET BY MOUTH EVERY DAY FOR RUNNY NOSE OR ITCHING   PRENATAL VITAMINS PO Take by mouth.   valACYclovir 500 MG tablet Commonly known as: VALTREX Take 500 mg by mouth 2 (two) times daily.         Discharge home in stable condition Infant Feeding: Breast Infant Disposition:home with mother Discharge instruction: per After Visit  Summary and Postpartum booklet. Activity: Advance as tolerated. Pelvic rest for 6 weeks.  Diet: routine diet Anticipated Birth Control: Unsure Postpartum Appointment:6 weeks Additional Postpartum F/U:  none Future Appointments:No future appointments. Follow up Visit:  Follow-up Information     Aloha Gell, MD Follow up.   Specialty: Obstetrics and Gynecology Contact information: [place] [address] [phone]                     10/24/2021 Charyl Bigger, MD

## 2021-10-24 NOTE — Lactation Note (Signed)
This note was copied from a baby's chart. Lactation Consultation Note  Patient Name: Anna Oconnor [id number] Date: 10/24/2021 Reason for consult: Follow-up assessment Age:34 hours   P1 mother whose infant is now 61 hours old.  This is a term baby at 41+2 weeks.    Reviewed current feeding plan and mother feels like "Anna Oconnor" has improved greatly since birth.  Mother has learned to latch deeply with only initial sensitivity which eases within a minute after breast feeding.  She has given a little bit of formula supplementation as needed.    Mother had no further questions/concerns.  Encouragement given and reminded mother that she can always call for any concerns.  Discussed the breast feeding support group.  Mother has a DEBP for home use.  Father present and asleep on the couch.   Maternal Data1    Feeding    LATCH Score                    Lactation Tools Discussed/Used    Interventions Interventions: Education;Breast feeding basics reviewed  Discharge Discharge Education: Engorgement and breast care Pump: Personal  Consult Status Consult Status: Complete Date: 10/24/21 Follow-up type: Call as needed    Anna Oconnor 10/24/2021, 9:41 AM

## 2021-10-25 LAB — T.PALLIDUM AB, TOTAL: T Pallidum Abs: NONREACTIVE

## 2021-11-02 ENCOUNTER — Telehealth (HOSPITAL_COMMUNITY): Payer: Self-pay | Admitting: *Deleted

## 2021-11-02 NOTE — Telephone Encounter (Signed)
Attempted hospital discharge follow-up call. Left message for patient to return RN call. Deforest Hoyles, RN, 11/02/21, [phone]

## 2021-12-07 DIAGNOSIS — Z124 Encounter for screening for malignant neoplasm of cervix: Secondary | ICD-10-CM | POA: Diagnosis not present

## 2021-12-07 DIAGNOSIS — O9213 Cracked nipple associated with lactation: Secondary | ICD-10-CM | POA: Diagnosis not present

## 2021-12-07 DIAGNOSIS — Z309 Encounter for contraceptive management, unspecified: Secondary | ICD-10-CM | POA: Diagnosis not present

## 2022-01-11 DIAGNOSIS — M79641 Pain in right hand: Secondary | ICD-10-CM | POA: Diagnosis not present

## 2022-01-11 DIAGNOSIS — M79642 Pain in left hand: Secondary | ICD-10-CM | POA: Diagnosis not present

## 2022-01-19 DIAGNOSIS — F53 Postpartum depression: Secondary | ICD-10-CM | POA: Diagnosis not present

## 2022-01-29 DIAGNOSIS — H00014 Hordeolum externum left upper eyelid: Secondary | ICD-10-CM | POA: Diagnosis not present

## 2022-01-29 DIAGNOSIS — H00036 Abscess of eyelid left eye, unspecified eyelid: Secondary | ICD-10-CM | POA: Diagnosis not present

## 2022-05-24 DIAGNOSIS — Z13220 Encounter for screening for lipoid disorders: Secondary | ICD-10-CM | POA: Diagnosis not present

## 2022-05-24 DIAGNOSIS — R5383 Other fatigue: Secondary | ICD-10-CM | POA: Diagnosis not present

## 2022-05-26 DIAGNOSIS — M654 Radial styloid tenosynovitis [de Quervain]: Secondary | ICD-10-CM | POA: Diagnosis not present
